# Patient Record
Sex: Female | Born: 1945 | Race: White | Hispanic: No | Marital: Married | Smoking: Former smoker
Health system: Southern US, Community
[De-identification: ages and names within clinical notes are randomized; demographics above are authoritative.]

## PROBLEM LIST (undated history)

## (undated) DIAGNOSIS — M8580 Other specified disorders of bone density and structure, unspecified site: Secondary | ICD-10-CM

## (undated) DIAGNOSIS — M1990 Unspecified osteoarthritis, unspecified site: Secondary | ICD-10-CM

## (undated) DIAGNOSIS — Z923 Personal history of irradiation: Secondary | ICD-10-CM

## (undated) DIAGNOSIS — E039 Hypothyroidism, unspecified: Secondary | ICD-10-CM

## (undated) DIAGNOSIS — K219 Gastro-esophageal reflux disease without esophagitis: Secondary | ICD-10-CM

## (undated) DIAGNOSIS — G4730 Sleep apnea, unspecified: Secondary | ICD-10-CM

## (undated) DIAGNOSIS — IMO0002 Reserved for concepts with insufficient information to code with codable children: Secondary | ICD-10-CM

## (undated) DIAGNOSIS — IMO0001 Reserved for inherently not codable concepts without codable children: Secondary | ICD-10-CM

## (undated) DIAGNOSIS — Z860100 Personal history of colon polyps, unspecified: Secondary | ICD-10-CM

## (undated) DIAGNOSIS — C50919 Malignant neoplasm of unspecified site of unspecified female breast: Secondary | ICD-10-CM

## (undated) DIAGNOSIS — E785 Hyperlipidemia, unspecified: Secondary | ICD-10-CM

## (undated) HISTORY — PX: TONSILLECTOMY: SUR1361

## (undated) HISTORY — PX: ABDOMINAL HYSTERECTOMY: SHX81

## (undated) HISTORY — DX: Other specified disorders of bone density and structure, unspecified site: M85.80

## (undated) HISTORY — PX: CERVICAL DISC SURGERY: SHX588

## (undated) HISTORY — DX: Personal history of colon polyps, unspecified: Z86.0100

## (undated) HISTORY — PX: CATARACT EXTRACTION: SUR2

## (undated) HISTORY — DX: Hyperlipidemia, unspecified: E78.5

## (undated) HISTORY — DX: Hypothyroidism, unspecified: E03.9

## (undated) HISTORY — DX: Gastro-esophageal reflux disease without esophagitis: K21.9

## (undated) HISTORY — DX: Unspecified osteoarthritis, unspecified site: M19.90

## (undated) HISTORY — PX: COLONOSCOPY: SHX174

## (undated) HISTORY — DX: Personal history of colonic polyps: Z86.010

## (undated) HISTORY — DX: Sleep apnea, unspecified: G47.30

---

## 1983-11-22 HISTORY — PX: BUNIONECTOMY: SHX129

## 1998-06-23 ENCOUNTER — Other Ambulatory Visit: Admission: RE | Admit: 1998-06-23 | Discharge: 1998-06-23 | Payer: Self-pay | Admitting: Gynecology

## 1999-06-30 ENCOUNTER — Other Ambulatory Visit: Admission: RE | Admit: 1999-06-30 | Discharge: 1999-06-30 | Payer: Self-pay | Admitting: Gynecology

## 2000-07-10 ENCOUNTER — Encounter: Admission: RE | Admit: 2000-07-10 | Discharge: 2000-07-10 | Payer: Self-pay | Admitting: Gynecology

## 2000-07-10 ENCOUNTER — Encounter: Payer: Self-pay | Admitting: Gynecology

## 2000-07-19 ENCOUNTER — Other Ambulatory Visit: Admission: RE | Admit: 2000-07-19 | Discharge: 2000-07-19 | Payer: Self-pay | Admitting: Gynecology

## 2000-07-26 ENCOUNTER — Encounter: Payer: Self-pay | Admitting: Gynecology

## 2000-07-26 ENCOUNTER — Encounter: Admission: RE | Admit: 2000-07-26 | Discharge: 2000-07-26 | Payer: Self-pay | Admitting: Gynecology

## 2000-10-26 ENCOUNTER — Ambulatory Visit (HOSPITAL_COMMUNITY): Admission: RE | Admit: 2000-10-26 | Discharge: 2000-10-26 | Payer: Self-pay | Admitting: Gastroenterology

## 2000-10-26 ENCOUNTER — Encounter (INDEPENDENT_AMBULATORY_CARE_PROVIDER_SITE_OTHER): Payer: Self-pay | Admitting: Specialist

## 2001-07-13 ENCOUNTER — Encounter: Admission: RE | Admit: 2001-07-13 | Discharge: 2001-07-13 | Payer: Self-pay | Admitting: Gynecology

## 2001-07-13 ENCOUNTER — Encounter: Payer: Self-pay | Admitting: Gynecology

## 2001-07-24 ENCOUNTER — Other Ambulatory Visit: Admission: RE | Admit: 2001-07-24 | Discharge: 2001-07-24 | Payer: Self-pay | Admitting: Gynecology

## 2002-07-15 ENCOUNTER — Encounter: Payer: Self-pay | Admitting: Gynecology

## 2002-07-15 ENCOUNTER — Encounter: Admission: RE | Admit: 2002-07-15 | Discharge: 2002-07-15 | Payer: Self-pay | Admitting: Gynecology

## 2002-08-06 ENCOUNTER — Other Ambulatory Visit: Admission: RE | Admit: 2002-08-06 | Discharge: 2002-08-06 | Payer: Self-pay | Admitting: Gynecology

## 2003-07-18 ENCOUNTER — Encounter: Payer: Self-pay | Admitting: Endocrinology

## 2003-07-18 ENCOUNTER — Encounter: Admission: RE | Admit: 2003-07-18 | Discharge: 2003-07-18 | Payer: Self-pay | Admitting: Endocrinology

## 2003-08-05 ENCOUNTER — Ambulatory Visit (HOSPITAL_COMMUNITY): Admission: RE | Admit: 2003-08-05 | Discharge: 2003-08-06 | Payer: Self-pay | Admitting: Neurosurgery

## 2003-08-05 ENCOUNTER — Encounter: Payer: Self-pay | Admitting: Neurosurgery

## 2003-09-25 ENCOUNTER — Other Ambulatory Visit: Admission: RE | Admit: 2003-09-25 | Discharge: 2003-09-25 | Payer: Self-pay | Admitting: Gynecology

## 2004-01-23 ENCOUNTER — Ambulatory Visit (HOSPITAL_COMMUNITY): Admission: RE | Admit: 2004-01-23 | Discharge: 2004-01-23 | Payer: Self-pay | Admitting: Gastroenterology

## 2004-07-27 ENCOUNTER — Encounter: Admission: RE | Admit: 2004-07-27 | Discharge: 2004-07-27 | Payer: Self-pay | Admitting: Gynecology

## 2004-10-06 ENCOUNTER — Other Ambulatory Visit: Admission: RE | Admit: 2004-10-06 | Discharge: 2004-10-06 | Payer: Self-pay | Admitting: Gynecology

## 2005-08-10 ENCOUNTER — Encounter: Admission: RE | Admit: 2005-08-10 | Discharge: 2005-08-10 | Payer: Self-pay | Admitting: Endocrinology

## 2005-10-10 ENCOUNTER — Other Ambulatory Visit: Admission: RE | Admit: 2005-10-10 | Discharge: 2005-10-10 | Payer: Self-pay | Admitting: Gynecology

## 2006-08-15 ENCOUNTER — Encounter: Admission: RE | Admit: 2006-08-15 | Discharge: 2006-08-15 | Payer: Self-pay | Admitting: Endocrinology

## 2006-10-17 ENCOUNTER — Other Ambulatory Visit: Admission: RE | Admit: 2006-10-17 | Discharge: 2006-10-17 | Payer: Self-pay | Admitting: Gynecology

## 2007-08-21 ENCOUNTER — Encounter: Admission: RE | Admit: 2007-08-21 | Discharge: 2007-08-21 | Payer: Self-pay | Admitting: Endocrinology

## 2007-10-29 ENCOUNTER — Other Ambulatory Visit: Admission: RE | Admit: 2007-10-29 | Discharge: 2007-10-29 | Payer: Self-pay | Admitting: Gynecology

## 2008-08-25 ENCOUNTER — Encounter: Admission: RE | Admit: 2008-08-25 | Discharge: 2008-08-25 | Payer: Self-pay | Admitting: Endocrinology

## 2009-09-11 ENCOUNTER — Encounter: Admission: RE | Admit: 2009-09-11 | Discharge: 2009-09-11 | Payer: Self-pay | Admitting: Endocrinology

## 2010-09-16 ENCOUNTER — Encounter: Admission: RE | Admit: 2010-09-16 | Discharge: 2010-09-16 | Payer: Self-pay | Admitting: Gynecology

## 2011-04-08 NOTE — Op Note (Signed)
[facility]  Patient:    BAYLIE, DRAKES                     MRN: [id number] Proc. Date: 10/26/00 Adm. Date:  [id number] Attending:  Nelda Marseille CC:         [name], M.D.   Operative Report  PROCEDURE:  Colonoscopy with hot biopsy.  ENDOSCOPIST:  Petra Kuba, M.D.  INDICATIONS:  Chronic constipation and hemorrhoids, due for colonic screening. Consent was signed after risks, benefits, methods, and options were thoroughly discussed in the office.  MEDICINES USED:  Demerol 80, Versed 8.  DESCRIPTION OF PROCEDURE:  Rectum was inspected and was pertinent for small hemorrhoids.  Digital exam was negative.  Video colonoscope was inserted and very easily advanced around the colon to the cecum.  On insertion in the mid descending, a 2 to 3 mm polyp was seen and was cold biopsied x 1 to mark it. The scope was then advanced to the cecum by abdominal pressure.  The cecum was identified by the appendiceal orifice and the ileocecal valve.  Resectoscope was inserted a short ways into the terminal ileum which was normal.  Photo documentation was obtained.  The scope was slowly withdrawn.  The prep was adequate.  There was minimal liquid stool that required washing and suctioning on slow withdrawal through the colon.  The cecum, ascending, and a majority of the transverse was normal.  In the splenic flexure, a 1 mm polyp was seen.  It was hot biopsied x 1.  The scope was slowly withdrawn back through the left side of the colon.  Two additional tiny polyps were seen in the sigmoid and were each hot biopsied x 1.  All were put in the same container.  Once back in the rectum, the scope was retroflexed, pertinent for some small internal hemorrhoids.  The scope was straightened and readvanced to the polyps seen on insertion which was at a similar spot.  This was not seen on withdrawal, but we were able to find the spot easily by advancing to roughly 45  cm where we had seen the polyp before.  It was hot biopsied x 1.  Resectoscope was removed.  The patient tolerated the procedure well.  There was no obvious immediate complication.  ENDOSCOPIC DIAGNOSIS: 1. Internal and external hemorrhoids, small. 2. Four left-sided small polyps, all hot biopsied x 1 and one cold biopsied    as well on insertion. 3. Otherwise within normal limits to the terminal ileum.  PLAN:  Await pathology to determine future colonic screening.  Will need post polypectomy instructions.  To see back p.r.n., otherwise yearly rectals and guaiacs, Dr. Nicholas Lose or Dr. [name]. DD:  10/26/00 TD:  10/26/00 Job: [id number]

## 2011-04-08 NOTE — Op Note (Signed)
NAME:  Heather Patton, Heather Patton                        ACCOUNT NO.:  [id number]   MEDICAL RECORD NO.:  [id number]                   PATIENT TYPE:  OIB   LOCATION:  NA                                   FACILITY:  [facility]   PHYSICIAN:  Danae Orleans. Venetia Maxon, M.D.               DATE OF BIRTH:  May 29, 1946   DATE OF PROCEDURE:  08/05/2003  DATE OF DISCHARGE:                                 OPERATIVE REPORT   PREOPERATIVE DIAGNOSIS:  Herniated cervical disk with cervical spondylosis,  degenerative disk disease and cervical radiculopathy, C6-7 level.   POSTOPERATIVE DIAGNOSIS:  Herniated cervical disk with cervical spondylosis,  degenerative disk disease and cervical radiculopathy, C6-7 level.   OPERATION PERFORMED:  Anterior cervical decompression and fusion, C6-7 with  allograft bone graft and anterior cervical plate.   SURGEON:  Danae Orleans. Venetia Maxon, M.D.   ASSISTANT:  Payton Doughty, M.D.   ANESTHESIA:  General endotracheal.   ESTIMATED BLOOD LOSS:  Minimal.   COMPLICATIONS:  None.   DISPOSITION:  To recovery.   INDICATIONS FOR PROCEDURE:  [name] is a 65 year old woman with a  herniated cervical disk at C6-7 on the right with significant right C7  radiculopathy.  It was elected to take her to surgery for anterior cervical  decompression and fusion.   DESCRIPTION OF PROCEDURE:  Ms. Scadden was brought to the operating room.  Following satisfactory and uncomplicated induction of general endotracheal  anesthesia and placement of intravenous lines, the patient was placed in  supine position on the operating table.  Her neck was placed in slight  extension.  She was placed in 10 pounds of halter traction. Her anterior  neck was then prepped and draped in the usual sterile fashion.  The area of  planned incision was infiltrated with 0.25% Marcaine and 0.5% lidocaine with  1:200,000 epinephrine.  Incision was made in the midline and carried to the  anterior border of the sternocleidomastoid  muscle sharply through the  platysma layer.  Sub platysmal dissection was performed, then blunt  dissection was performed along the anterior border of the  sternocleidomastoid muscle keeping the carotid sheath lateral, trachea and  esophagus medial exposing the anterior cervical spine.  Blunt bent spinal  needle was placed at the C5-6 and C6-7 level and intraoperative x-ray  demonstrated needle at the C5-6 level.  Subsequently, the longus colli  muscles were taken down from the anterior cervical spine from C6 through C7  levels bilaterally and a self-retaining Shadowline retractor was placed.  Large ventral osteophytes were removed with a Leksell rongeur and the  interspace, which was entirely degenerated, was incised and the disk  material was removed in piecemeal fashion.  The end plates were stripped of  residual disk material.  Disk space spreader was placed, microscope was  brought into the field and microscopic visualization using a high speed  drill with an A-2 equivalent bur, the end plates  of C6 and C7 were  decorticated and uncinate spurs drilled down.  Subsequently, the degenerated  posterior longitudinal ligament was then incised with an arachnoid knife and  this was removed in piecemeal fashion resulting in significant decompression  of the cervical spinal cord dura and also both C7 nerve roots were  decompressed widely as they extended out their neural foramina.  Hemostasis  was assured with Gelfoam soaked in thrombin and 8 mm corticocancellous bone  graft was inserted into the interspace and countersunk appropriately.  A 22  mm anterior Trinica anterior cervical plate was then affixed to the anterior  cervical spine using variable angled 14 x 4.2 mm screws.  Locking mechanisms  were engaged.  All screws had excellent purchase.  Soft tissues were  inspected and found to be in good repair.  A poorly penetrated x-ray was  obtained which demonstrated the upper screws at the C6  level.  It was felt  that a better x-ray would not be able to be obtained because of the  patient's body habitus because of the shoulders overlapping this level of  the neck. The platysma layer was then closed with 3-0 Vicryl sutures and the  skin edges were reapproximated with running 4-0 Vicryl subcuticular stitch.  The wound was dressed with Dermabond.  The patient was extubated in the  operating room and taken to the recovery room in stable and satisfactory  condition having tolerated the operation well.  Counts were correct at the  end of the case.                                                Danae Orleans. Venetia Maxon, M.D.    JDS/MEDQ  D:  08/05/2003  T:  08/06/2003  Job:  [id number]

## 2011-04-08 NOTE — Op Note (Signed)
NAME:  Heather Patton, Heather Patton                        ACCOUNT NO.:  [id number]   MEDICAL RECORD NO.:  [id number]                   PATIENT TYPE:  AMB   LOCATION:  ENDO                                 FACILITY:  [facility]   PHYSICIAN:  Petra Kuba, M.D.                 DATE OF BIRTH:  [birth year]   DATE OF PROCEDURE:  01/23/2004  DATE OF DISCHARGE:                                 OPERATIVE REPORT   PROCEDURE:  Colonoscopy.   INDICATION:  The patient with a history of colon polyps, due for repeat  screening.  Consent was signed after risks, benefits, methods, options  thoroughly discussed in the office in the past.   MEDICINES USED:  1. Demerol 75.  2. Versed 7.   DESCRIPTION OF PROCEDURE:  Rectal inspection was pertinent for external  hemorrhoids, small.  Digital exam was negative.  The pediatric video  adjustable colonoscope was inserted, easily advanced around the colon to the  cecum.  This did not require any abdominal pressure or any position changes.  No obvious abnormality was seen on insertion.  The cecum was identified by  the appendiceal orifice and the ileocecal valve.  Prep was adequate.  There  was some liquid stool that required washing and suctioning.  On slow  withdrawal through the colon, other than some very early diverticula on the  left side, just beginning to form.  No polyps, tumors, masses, or other  abnormalities were seen as we slowly withdrew back to the rectum.  Anorectal  pull-through and retroflexion confirmed some small hemorrhoids.  Scope was  reinserted a short ways up the left side of the colon; air was suctioned and  scope removed.  The patient tolerated the procedure well.  There was no  obvious immediate complication.   ENDOSCOPIC DIAGNOSES:  1. Internal-external hemorrhoids.  2. Just beginning of left salpingo-oophorectomy diverticula.  3. Otherwise, within normal limits to the cecum.   PLAN:  1. Yearly rectals and guaiacs per Dr. [name].  2. Happy to  see back p.r.n.  3. Otherwise, recheck colon screening in 5 years.                                               Petra Kuba, M.D.   MEM/MEDQ  D:  01/23/2004  T:  01/23/2004  Job:  [id number]   cc:   Jeannett Senior A. [name], M.D.  [address]  [place]  [place] [address]  Fax: [phone]

## 2011-08-11 ENCOUNTER — Other Ambulatory Visit: Payer: Self-pay | Admitting: Gynecology

## 2011-08-11 DIAGNOSIS — Z1231 Encounter for screening mammogram for malignant neoplasm of breast: Secondary | ICD-10-CM

## 2011-09-21 ENCOUNTER — Ambulatory Visit
Admission: RE | Admit: 2011-09-21 | Discharge: 2011-09-21 | Disposition: A | Discharge status: Home / Self Care | Payer: BC Managed Care – PPO | Source: Ambulatory Visit | Attending: Gynecology | Admitting: Gynecology

## 2011-09-21 DIAGNOSIS — Z1231 Encounter for screening mammogram for malignant neoplasm of breast: Secondary | ICD-10-CM

## 2011-09-27 ENCOUNTER — Other Ambulatory Visit: Payer: Self-pay | Admitting: Gynecology

## 2011-09-27 DIAGNOSIS — R928 Other abnormal and inconclusive findings on diagnostic imaging of breast: Secondary | ICD-10-CM

## 2011-10-12 ENCOUNTER — Other Ambulatory Visit: Payer: Self-pay | Admitting: Gynecology

## 2011-10-12 ENCOUNTER — Ambulatory Visit
Admission: RE | Admit: 2011-10-12 | Discharge: 2011-10-12 | Disposition: A | Discharge status: Home / Self Care | Payer: BC Managed Care – PPO | Source: Ambulatory Visit | Attending: Gynecology | Admitting: Gynecology

## 2011-10-12 DIAGNOSIS — R928 Other abnormal and inconclusive findings on diagnostic imaging of breast: Secondary | ICD-10-CM

## 2011-10-12 DIAGNOSIS — N6320 Unspecified lump in the left breast, unspecified quadrant: Secondary | ICD-10-CM

## 2011-10-21 ENCOUNTER — Other Ambulatory Visit: Payer: Self-pay | Admitting: Diagnostic Radiology

## 2011-10-21 ENCOUNTER — Other Ambulatory Visit: Payer: Self-pay | Admitting: Gynecology

## 2011-10-21 ENCOUNTER — Inpatient Hospital Stay: Admission: RE | Admit: 2011-10-21 | Payer: BC Managed Care – PPO | Source: Ambulatory Visit

## 2011-10-21 ENCOUNTER — Ambulatory Visit
Admission: RE | Admit: 2011-10-21 | Discharge: 2011-10-21 | Disposition: A | Discharge status: Home / Self Care | Payer: BC Managed Care – PPO | Source: Ambulatory Visit | Attending: Gynecology | Admitting: Gynecology

## 2011-10-21 DIAGNOSIS — N6320 Unspecified lump in the left breast, unspecified quadrant: Secondary | ICD-10-CM

## 2011-10-24 ENCOUNTER — Other Ambulatory Visit: Payer: Self-pay | Admitting: Gynecology

## 2011-10-24 DIAGNOSIS — C50912 Malignant neoplasm of unspecified site of left female breast: Secondary | ICD-10-CM

## 2011-10-27 ENCOUNTER — Other Ambulatory Visit: Payer: Self-pay | Admitting: *Deleted

## 2011-10-27 ENCOUNTER — Telehealth: Payer: Self-pay | Admitting: *Deleted

## 2011-10-27 DIAGNOSIS — C50919 Malignant neoplasm of unspecified site of unspecified female breast: Secondary | ICD-10-CM

## 2011-10-27 NOTE — Telephone Encounter (Signed)
Confirmed [facility] for 11/02/11 at 1215  Instructions and contact information given.

## 2011-10-28 ENCOUNTER — Ambulatory Visit
Admission: RE | Admit: 2011-10-28 | Discharge: 2011-10-28 | Disposition: A | Discharge status: Home / Self Care | Payer: BC Managed Care – PPO | Source: Ambulatory Visit | Attending: Gynecology | Admitting: Gynecology

## 2011-10-28 DIAGNOSIS — C50912 Malignant neoplasm of unspecified site of left female breast: Secondary | ICD-10-CM

## 2011-10-28 MED ORDER — GADOBENATE DIMEGLUMINE 529 MG/ML IV SOLN
20.0000 mL | Freq: Once | INTRAVENOUS | Status: AC | PRN
  Administered 2011-10-28: 20 mL via INTRAVENOUS

## 2011-11-02 ENCOUNTER — Ambulatory Visit: Payer: BC Managed Care – PPO

## 2011-11-02 ENCOUNTER — Ambulatory Visit
Admission: RE | Admit: 2011-11-02 | Discharge: 2011-11-02 | Disposition: A | Discharge status: Home / Self Care | Payer: BC Managed Care – PPO | Source: Ambulatory Visit | Attending: Radiation Oncology | Admitting: Radiation Oncology

## 2011-11-02 ENCOUNTER — Ambulatory Visit (HOSPITAL_BASED_OUTPATIENT_CLINIC_OR_DEPARTMENT_OTHER): Payer: BC Managed Care – PPO | Admitting: Oncology

## 2011-11-02 ENCOUNTER — Encounter (INDEPENDENT_AMBULATORY_CARE_PROVIDER_SITE_OTHER): Payer: Self-pay | Admitting: General Surgery

## 2011-11-02 ENCOUNTER — Telehealth: Payer: Self-pay | Admitting: *Deleted

## 2011-11-02 ENCOUNTER — Encounter: Payer: Self-pay | Admitting: *Deleted

## 2011-11-02 ENCOUNTER — Ambulatory Visit: Payer: BC Managed Care – PPO | Attending: General Surgery | Admitting: Physical Therapy

## 2011-11-02 ENCOUNTER — Other Ambulatory Visit (HOSPITAL_BASED_OUTPATIENT_CLINIC_OR_DEPARTMENT_OTHER): Payer: BC Managed Care – PPO

## 2011-11-02 ENCOUNTER — Ambulatory Visit (HOSPITAL_BASED_OUTPATIENT_CLINIC_OR_DEPARTMENT_OTHER): Payer: BC Managed Care – PPO | Admitting: General Surgery

## 2011-11-02 VITALS — BP 146/81 | HR 88 | Temp 97.6°F | Ht 63.5 in | Wt 238.7 lb

## 2011-11-02 DIAGNOSIS — Z510 Encounter for antineoplastic radiation therapy: Secondary | ICD-10-CM | POA: Insufficient documentation

## 2011-11-02 DIAGNOSIS — C50312 Malignant neoplasm of lower-inner quadrant of left female breast: Secondary | ICD-10-CM | POA: Insufficient documentation

## 2011-11-02 DIAGNOSIS — C50919 Malignant neoplasm of unspecified site of unspecified female breast: Secondary | ICD-10-CM

## 2011-11-02 DIAGNOSIS — M899 Disorder of bone, unspecified: Secondary | ICD-10-CM | POA: Insufficient documentation

## 2011-11-02 DIAGNOSIS — Z87891 Personal history of nicotine dependence: Secondary | ICD-10-CM | POA: Insufficient documentation

## 2011-11-02 DIAGNOSIS — K219 Gastro-esophageal reflux disease without esophagitis: Secondary | ICD-10-CM | POA: Insufficient documentation

## 2011-11-02 DIAGNOSIS — C50319 Malignant neoplasm of lower-inner quadrant of unspecified female breast: Secondary | ICD-10-CM

## 2011-11-02 DIAGNOSIS — E785 Hyperlipidemia, unspecified: Secondary | ICD-10-CM | POA: Insufficient documentation

## 2011-11-02 DIAGNOSIS — E039 Hypothyroidism, unspecified: Secondary | ICD-10-CM | POA: Insufficient documentation

## 2011-11-02 DIAGNOSIS — Z170 Estrogen receptor positive status [ER+]: Secondary | ICD-10-CM

## 2011-11-02 DIAGNOSIS — IMO0001 Reserved for inherently not codable concepts without codable children: Secondary | ICD-10-CM | POA: Insufficient documentation

## 2011-11-02 DIAGNOSIS — Z803 Family history of malignant neoplasm of breast: Secondary | ICD-10-CM | POA: Insufficient documentation

## 2011-11-02 DIAGNOSIS — Z90710 Acquired absence of both cervix and uterus: Secondary | ICD-10-CM | POA: Insufficient documentation

## 2011-11-02 DIAGNOSIS — G4730 Sleep apnea, unspecified: Secondary | ICD-10-CM | POA: Insufficient documentation

## 2011-11-02 DIAGNOSIS — N63 Unspecified lump in unspecified breast: Secondary | ICD-10-CM

## 2011-11-02 DIAGNOSIS — Z79899 Other long term (current) drug therapy: Secondary | ICD-10-CM | POA: Insufficient documentation

## 2011-11-02 LAB — CBC WITH DIFFERENTIAL/PLATELET
EOS%: 1.8 % (ref 0.0–7.0)
Eosinophils Absolute: 0.1 10*3/uL (ref 0.0–0.5)
HGB: 14.4 g/dL (ref 11.6–15.9)
MCH: 28.8 pg (ref 25.1–34.0)
MCV: 86.3 fL (ref 79.5–101.0)
MONO%: 4.2 % (ref 0.0–14.0)
NEUT#: 5.1 10*3/uL (ref 1.5–6.5)
Platelets: 245 10*3/uL (ref 145–400)
RBC: 5.01 10*6/uL (ref 3.70–5.45)
WBC: 7.1 10*3/uL (ref 3.9–10.3)
lymph#: 1.5 10*3/uL (ref 0.9–3.3)

## 2011-11-02 LAB — COMPREHENSIVE METABOLIC PANEL
AST: 20 U/L (ref 0–37)
Albumin: 4 g/dL (ref 3.5–5.2)
BUN: 13 mg/dL (ref 6–23)
Calcium: 9.9 mg/dL (ref 8.4–10.5)
Glucose, Bld: 181 mg/dL — ABNORMAL HIGH (ref 70–99)
Potassium: 3.7 mEq/L (ref 3.5–5.3)
Total Bilirubin: 0.4 mg/dL (ref 0.3–1.2)
Total Protein: 7.8 g/dL (ref 6.0–8.3)

## 2011-11-02 LAB — CANCER ANTIGEN 27.29: CA 27.29: 27 U/mL (ref 0–39)

## 2011-11-02 NOTE — Telephone Encounter (Signed)
gave patient appointment for 12-2011 printed out calendar and gave to the patient 

## 2011-11-02 NOTE — Progress Notes (Signed)
Mailed after appt letter to pt. 

## 2011-11-02 NOTE — Progress Notes (Signed)
CC: Heather Patton    HPI: The patient is a 65 year old [place] woman who had routine screening mammography at the breast center 09/21/2011. The possible mass was noted in the left breast, and she was recalled for additional views November 21. Diagnostic mammography showed a low density spiculated mass in the lower outer quadrant of the left breast measuring 6 mm. This was not palpable to the mammographer. Ultrasound showed only normal tissue in the area in question.  According stereotactic biopsy was performed 10/21/2011 and showed ([id number]) and invasive lobular carcinoma, grade 1 or 2, with no her 2 amplification, 99% estrogen receptor positive, 94% progesterone receptor positive, with an MIB-1-1 of 15%.  With this information the patient underwent bilateral breast MRI 10/28/2011. A post biopsy hematoma was noted in the lower inner aspect of the left breast, with a small area of masslike enhancement associated with it. This area measured 7 mm. The MRI showed a second irregularly marginated mass in the central portion of the left breast 2.6 cm away from the biopsied mass. The second mass measured 5 mm maximally. There was no evidence of the ligaments in the right breast and no adenopathy noted.  With this information the patient was referred to the multidisciplinary breast clinic on 11/02/2011  Past medical history:    Past Medical History  Diagnosis Date  . Degenerative joint disease   . GERD (gastroesophageal reflux disease)   . Osteopenia   . Sleep apnea   . Hx of colonic polyps   . Hypothyroidism   . Hyperlipidemia     Past surgical history:     Past Surgical History  Procedure Date  . Tonsillectomy   . Abdominal hysterectomy   . Cervical disc surgery     Family history:    Family History  Problem Relation Age of Onset  . Cancer Mother   . Cancer Cousin    the patient's father died at the age of 68 from a myocardial infarction. The patient's mother was diagnosed with  breast cancer at age 37, but she died at age 46 following a fall. The patient had no brothers, has one sister. The only other breast cancer diagnosis in the family was a cousin on the maternal side. The patient does not know at what age she was diagnosed. There is no history of ovarian cancer in the family.  GYN history:  GX P0. Menarche age 45, menopause 9 she took hormone replacement for about 10 years, discontinuing that in 2006.  Social history:  She is a retired [employer] officer. Her husband Elijah Birk presents today is retired from the [employer]. The patient is a close friend of one of my patients, Airlie Blumenberg.  Health maintenance:  She smoked perhaps a half a pack a day up until 1980. She drinks about a glass of wine at night with supper. She had a colonoscopy in May of 2010, which showed no high-grade dysplasia or malignancy and the adenomas removed. She had a bone density September of 2012 which she tells me was normal. This is available through Dr. Leonette Most Lomax's office. Most recent Pap smear was September of 2012. The patient does have advanced directives in place. Her husband Elijah Birk is her healthcare power of attorney.  Allergies: No Known Allergies  Current outpatient prescriptions:calcium citrate-vitamin D (CITRACAL+D) 315-200 MG-UNIT per tablet, Take 1 tablet by mouth daily. Calcium citrate 630mg     Vitamin D  500iu , Disp: , Rfl: ;  ezetimibe-simvastatin (VYTORIN) 10-80 MG per tablet,  Take 1 tablet by mouth at bedtime.  , Disp: , Rfl: ;  levothyroxine (SYNTHROID, LEVOTHROID) 150 MCG tablet, Take 150 mcg by mouth daily.  , Disp: , Rfl:  OVER THE COUNTER MEDICATION, Take 100 mg by mouth. Stool softner , Disp: , Rfl:   ROS in detail review of systems today was entirely negative, and certainly does not give me any hands of metastatic disease. She does not exercise on a regular basis.  Physical Exam:  Middle-aged white woman who appears comfortable.  Blood pressure 146/81, pulse 88,  temperature 97.6 F (36.4 C), temperature source Oral, height 5' 3.5" (1.613 m), weight 238 lb 11.2 oz (108.274 kg).  Sclerae unicteric Oropharynx clear No peripheral adenopathy, with a negative left axilla Lungs no rales or rhonchi Heart regular rate and rhythm Abd benign MSK no focal spinal tenderness, no peripheral edema Neuro: nonfocal Breasts: Right breast no suspicious masses. Left breast status post recent lumpectomy. I do not palpate any discreet mass. There is no skin change or nipple retraction.  LABS      Component Value Date/Time   WBC 7.1 11/02/2011 1219   RBC 5.01 11/02/2011 1219   HGB 14.4 11/02/2011 1219   HCT 43.2 11/02/2011 1219   PLT 245 11/02/2011 1219   MCV 86.3 11/02/2011 1219   MCH 28.8 11/02/2011 1219   MCHC 33.3 11/02/2011 1219   RDW 13.5 11/02/2011 1219   LYMPHSABS 1.5 11/02/2011 1219   MONOABS 0.3 11/02/2011 1219   EOSABS 0.1 11/02/2011 1219   BASOSABS 0.0 11/02/2011 1219      Chemistry      Component Value Date/Time   NA 140 11/02/2011 1219   K 3.7 11/02/2011 1219   CL 104 11/02/2011 1219   CO2 26 11/02/2011 1219   BUN 13 11/02/2011 1219   CREATININE 0.69 11/02/2011 1219      Component Value Date/Time   CALCIUM 9.9 11/02/2011 1219   ALKPHOS 92 11/02/2011 1219   AST 20 11/02/2011 1219   ALT 23 11/02/2011 1219   BILITOT 0.4 11/02/2011 1219        Studies:   US Breast Left  10/12/2011  *RADIOLOGY REPORT*  Clinical Data:  Abnormal screening, left breast  DIGITAL DIAGNOSTIC LEFT MAMMOGRAM WITHOUT CAD AND LEFT BREAST ULTRASOUND:  Comparison:  Multiple priors  Findings:  Spot compression views in the lower outer quadrant of the left breast, middle third confirm the presence of a low density spiculated mass with ill-defined margins measuring approximately 0.6 x 0.4 cm.  On physical exam, I palpate normal tissue in the lower inner quadrant and medial central aspect of the left breast.  Ultrasound is performed, showing normal tissue in the  lower inner quadrant of the left breast and medial central aspect of the left breast.  Considering the appearance on mammography, a biopsy is recommended.  IMPRESSION: Mass, left breast which a stereotactic biopsy is scheduled for November 30th at 3:00 p.m.  BI-RADS CATEGORY 4:  Suspicious abnormality - biopsy should be considered.  Original Report Authenticated By: Hiram Gash, M.D.   Mr Breast Bilateral W Wo Contrast  11/01/2011  **ADDENDUM** CREATED: 11/01/2011 16:28:29  At the request of patient, I discussed the findings with her by telephone.  She is scheduled to be seen in the multidisciplinary clinic on 11/02/2011.  Addended by:  Blair Hailey. Manson Passey, M.D. on 11/01/2011 16:28:29.  **END ADDENDUM** SIGNED BY: Blair Hailey. Manson Passey, M.D.   11/01/2011  *RADIOLOGY REPORT*  Clinical Data: Recently diagnosed left breast invasive  lobular carcinoma.  Mother diagnosed with breast cancer at age 45.  The patient also reports a cousin diagnosed with breast cancer.  BUN and creatinine were obtained on site at [facility] at [address] Results:  BUN 12 mg/dL,  Creatinine 0.8 mg/dL.  BILATERAL BREAST MRI WITH AND WITHOUT CONTRAST  Technique: Multiplanar, multisequence MR images of both breasts were obtained prior to and following the intravenous administration of 20ml of MultiHance.  Three dimensional images were evaluated at the independent DynaCad workstation.  Comparison:  Recent mammogram, ultrasound and biopsy examinations.  Findings: Minimal background parenchymal enhancement in both breasts.  Small postbiopsy hematoma and biopsy marker clip artifact in the lower inner aspect of the midportion of the left breast.  This corresponds to the location of the recently biopsied invasive lobular carcinoma.  At the posterior margin of the biopsy cavity, there is a small, oval area of mass-like enhancement.  This has a mixture of enhancement kinetics, including rapid washin/washout. This area measures 7  x 7 x 2 mm in maximum dimensions and has mildly irregular margins.  There is a second small, irregularly marginated oval mass in the central portion of the left breast.  This is located 2.6 cm superior, posterior and lateral to the biopsy-proven malignancy. This mass measures 5 x 5 x 4 mm in maximum dimensions and has a mixture of enhancement kinetics, including rapid washin/washout. No corresponding mammographic abnormality.  The two masses have a combined outer margin to outer margin diameter of 3.7 cm.  No additional masses or areas of enhancement suspicious for malignancy in either breast.  No abnormal appearing lymph nodes.  IMPRESSION:  1.  7 x 7 x 2 mm biopsy-proven invasive lobular carcinoma in the lower inner quadrant of the left breast. 2.  5 x 5 x 4 mm mass in the central left breast with MR features highly suspicious for malignancy.  If breast conservation is desired, an MR guided core needle biopsy of this mass would be recommended. 3.  No evidence of malignancy in the right breast and no adenopathy.  THREE-DIMENSIONAL MR IMAGE RENDERING ON INDEPENDENT WORKSTATION:  Three-dimensional MR images were rendered by post-processing of the original MR data on an independent workstation.  The three- dimensional MR images were interpreted, and findings were reported in the accompanying complete MRI report for this study.  BI-RADS CATEGORY 5:  Highly suggestive of malignancy - appropriate action should be taken.  Recommendation:  Left breast MR guided core needle biopsy if breast conservation is desired.  Original Report Authenticated By: Patterson Hammersmith, M.D.   Mm Breast Stereo Biopsy Left  10/24/2011  **ADDENDUM** CREATED: 10/24/2011 14:13:15  Invasive mammary carcinoma with lobular features was reported histologically.  This corresponds well with the imaging findings. The patient was contacted by telephone on 10/24/2011 and given the results of the biopsy.  She states the wound site is clean and dry with  no hematoma.  The patient was informed that she would be contacted by the Multidisciplinary Clinic regarding an appointment on 11/02/2011.  She has also been scheduled  for a breast MRI.  Addended by:  Littie Deeds. Judyann Munson, M.D. on 10/24/2011 14:13:15.  **END ADDENDUM** SIGNED BY: Littie Deeds. Judyann Munson, M.D.   10/24/2011  *RADIOLOGY REPORT*  Clinical Data:  Asymmetry left breast lower inner quadrant  STEREOTACTIC-GUIDED VACUUM ASSISTED BIOPSY OF THE LEFT BREAST AND SPECIMEN RADIOGRAPH  Informed written consent was obtained.  There is an asymmetry in the left breast lower inner quadrant, as  seen on recent screening and diagnostic mammography performed 09/21/11 and 10/12/11.  Using sterile technique, 2% lidocaine, stereotactic guidance, and a 9 gauge vacuum assisted device, biopsy was performed of the left asymmetry.  At the conclusion of the procedure, a tissue marker clip was deployed into the biopsy cavity.  Follow-up 2-view mammogram confirmed clip to be in correct position.  IMPRESSION: Stereotactic-guided biopsy of left breast asymmetry.  No apparent complications.  Original Report Authenticated By: Littie Deeds. Judyann Munson, M.D.   Mm Digital Diag Ltd L  10/12/2011  *RADIOLOGY REPORT*  Clinical Data:  Abnormal screening, left breast  DIGITAL DIAGNOSTIC LEFT MAMMOGRAM WITHOUT CAD AND LEFT BREAST ULTRASOUND:  Comparison:  Multiple priors  Findings:  Spot compression views in the lower outer quadrant of the left breast, middle third confirm the presence of a low density spiculated mass with ill-defined margins measuring approximately 0.6 x 0.4 cm.  On physical exam, I palpate normal tissue in the lower inner quadrant and medial central aspect of the left breast.  Ultrasound is performed, showing normal tissue in the lower inner quadrant of the left breast and medial central aspect of the left breast.  Considering the appearance on mammography, a biopsy is recommended.  IMPRESSION: Mass, left breast which a stereotactic biopsy is  scheduled for November 30th at 3:00 p.m.  BI-RADS CATEGORY 4:  Suspicious abnormality - biopsy should be considered.  Original Report Authenticated By: Hiram Gash, M.D.     Assessment: 65 year old [place] woman status post left breast biopsy November of 2012 for a clinical T1b N0 (Stage I) invasive lobular carcinoma, grade 1 or 2, strongly estrogen and progesterone receptor positive, HER-2 not amplified, with an MIB-1-1 of 15%; with a second, 5 mm mass in the same breast noted by MRI.  Plan: We spent the better part of her hour-long visit today discussing the difference between ductal and lobular breast cancers. She understands that we essentially treat these the same, but there is subtle differences including difficulty in detecting lobular, difficulty palpating the edge of the lobular cancer during surgery, and decreased response to neoadjuvant chemotherapy. She understands we need to first of all biopsy the second mass in the ipsilateral breast. If this is also of carcinoma, it is not clear whether or a large lumpectomy may encompass both masses with good cosmesis. Accordingly a mastectomy would have to be considered. If so, she should meet with a plastic surgeon to discuss reconstructive options. On the other hand if the second masses negative she would be an excellent candidate for breast conserving surgery.   From the systemic therapy point of view I do not anticipate her requiring chemotherapy and therefore would not suggest port placement at the time of definitive surgery. I have made her a return appointment here early February. By then we should have all the information necessary to be able to provide her with detailed prognostic information and make it a tentative decision regarding adjuvant systemic therapy.  This plan is concordant with NCCN guidelines.   Kushi Kun C 11/02/2011, 6:40 PM

## 2011-11-02 NOTE — Progress Notes (Signed)
Subjective:     Patient ID: Heather Patton, female   DOB: 1946/01/09, 65 y.o.   MRN: [id number]  HPI We are asked to see the patient in consultation by Dr. Anselmo Pickler to evaluate her for a left breast cancer. The patient is a 65 year old white female who recently underwent a screening mammogram. At that time she was not having any breast pain. She was not having discharge from her nipple on either side. Her mammogram identified a lesion in the lower inner quadrant of the left breast. By MRI this measured 7 mm. This was biopsied and came back as an invasive breast cancer. A second lesion was also identified by MRI 2.5 cm away. This is scheduled to be biopsied in the near future. She has otherwise been healthy. She has no complaints today. Her tumor is ER/PR positive and HER-2 negative.  Review of Systems  Constitutional: Negative.   HENT: Negative.   Eyes: Negative.   Respiratory: Negative.   Cardiovascular: Negative.   Gastrointestinal: Negative.   Genitourinary: Negative.   Musculoskeletal: Negative.   Skin: Negative.   Neurological: Negative.   Hematological: Negative.   Psychiatric/Behavioral: Negative.        Objective:   Physical Exam  Constitutional: She is oriented to person, place, and time. She appears well-developed and well-nourished.  HENT:  Head: Normocephalic and atraumatic.  Eyes: Conjunctivae and EOM are normal. Pupils are equal, round, and reactive to light.  Neck: Normal range of motion. Neck supple.  Cardiovascular: Normal rate, regular rhythm and normal heart sounds.   Pulmonary/Chest: Effort normal and breath sounds normal.       She has no palpable mass in either breast. No axillary supraclavicular cervical lymphadenopathy.  Abdominal: Soft. Bowel sounds are normal. She exhibits no mass. There is no tenderness.  Musculoskeletal: Normal range of motion.  Lymphadenopathy:    She has no cervical adenopathy.  Neurological: She is alert and oriented to person,  place, and time.  Skin: Skin is warm and dry.  Psychiatric: She has a normal mood and affect. Her behavior is normal.       Assessment:     The patient has a small invasive cancer in the lower inner quadrant of the left breast. She has a second area of concern nearby. If the second area is benign and she is a great candidate for breast conservation and would like to pursue this. If the second area is positive then she may still be a candidate for breast conservation given the generous size of her breast but it may be a little more complicated. She is trying to decide of the second area is positive whether she would want a mastectomy or not.    Plan:     She is scheduled for a biopsy of the secondary in the left breast in the next few days. We will call her with the results of her pathology. She will continue to consider her options so that she can make an informed decision once the second biopsy pathology is back

## 2011-11-02 NOTE — Progress Notes (Signed)
CC:   [name]. [name], M.D. [name], M.D. [name], M.D. Ollen Gross. [name], M.D.  REFERRING PHYSICIAN:  [name]. [name], M.D.  DIAGNOSIS:  Invasive mammary carcinoma of the left breast.  HPI:  Heather Patton is a very pleasant 65 year old female who is seen out the courtesy of Dr. [name] as part of the Multidisciplinary Breast [facility].  On routine screening mammography earlier this year, Heather Patton was noted to have a possible mass within the left breast.  In light of this, a digital diagnostic left mammogram was performed which revealed a spiculated mass with ill-defined borders measuring approximately 0.6 x 0.4 cm within the lower-inner quadrant of the left breast.  Ultrasound of this area revealed no suspicious changes.  The patient did proceed to undergo biopsy of the lower-inner quadrant of the left breast which revealed invasive mammary carcinoma, likely grade 1 or grade 2.  Features were most significant with invasive lobular carcinoma.  The tumor was HER2/neu negative, but ER/PR positive at 99% and 94% respectively.  Ki-67 was normal at 15%.  In light of these findings, the patient proceeded to undergo MRI of the chest which revealed within the lower-inner quadrant of the left breast a 7 x 7 x 2 mm lesion.  In addition, within the central left breast there was a separate area which measured 5 x 5 x 4 cm.  The second area was approximately 2.6 cm superior and posterior as well as lateral to the biopsy-proven malignancy.  The total area of the 2 masses was approximately 3.7 cm.  With these findings, the patient is now seen as part of the Multidisciplinary Breast Clinic.  PAST MEDICAL HISTORY:  ALLERGIES:  The patient has no known drug allergies.  CURRENT MEDICATIONS:  Vytorin 10/80 mg 1 tablet at bedtime.  The patient is also on Synthroid 150 mcg daily.  The patient also takes a calcium supplement.  PRIOR SURGERIES:  Tonsillectomy, abdominal hysterectomy,  and cervical disk surgery.  MEDICAL HISTORY:  Significant for GERD, osteopenia, sleep apnea, history of colonic polyps, hypothyroidism, and hyperlipidemia.  SOCIAL HISTORY:  The patient is a former smoker but stopped smoking in 1980.  The patient previously smoked a half-pack of cigarettes per day. The patient has approximately 6 glasses of wine per week.  The patient previously worked at [employer] but is now retired.  FAMILY HISTORY:  Significant for mother with history of breast cancer diagnosed at approximately age 71.  Also significant for cousin (maternal) with a questionable history of breast cancer.  There is no obvious family history of ovarian cancer.  REVIEW OF SYSTEMS:  The patient denied any pain in the breast area, nipple discharge, or bleeding prior to diagnosis.  The patient denies any new bony pain, headaches, dizziness, or blurred vision.  A complete review of systems was undertaken with the patient by myself and other than above-mentioned issues is unremarkable.  PHYSICAL EXAMINATION:  General:  This is a very pleasant 65 year old female in no acute distress.  She is accompanied by her husband on evaluation today.  Vital Signs:  Temperature 97.6, pulse 88, blood pressure is 146/81.  Height is 5 feet 3.5 inches.  Weight is 238 pounds. Examination of the neck and supraclavicular region reveals no evidence of adenopathy.  The axillary areas are free of adenopathy.  Examination of the lungs reveals them to be clear.  Heart:  Regular rhythm and rate. Examination the right breast reveals it to be large and pendulous  without mass or nipple discharge.  Examination of the left breast reveals it also to be large and pendulous without obvious mass or nipple discharge.  There is pain at the biopsy site in the lower-inner aspect of the left breast with some associated bruising.  Abdomen:  Soft and nontender with normal bowel sounds.  Neurological  Examination: Nonfocal.  LABORATORY DATA FROM TODAY:  White count 7.1, absolute neutrophil count is 5.1, hemoglobin 14.4, hematocrit 43.2, platelet count 145,000. Glucose is elevated at 181.  BUN 13, creatinine 0.69, potassium is 3.7, calcium is 9.9.  X-RAY STUDIES:  As summarized in the HPI.  IMPRESSION AND PLAN:  Invasive mammary carcinoma of the left breast. The patient has a separate area which will need to be biopsied before treatment planning can be generated.  The patient does understand this issue.  The patient seems to be leaning towards possible mastectomy if her second biopsy is positive.  But, given the close proximity to her known diagnosis site, the patient may be a potential candidate for breast-conserving therapy given her large breast size.  I, today, discussed in general terms radiation therapy side effects and potential toxicities with Heather Patton and her husband.  The patient appears to understand these issues at this time.  Final details concerning the patient's management are pending her second biopsy result.    ______________________________ Billie Lade, Ph.D., M.D. JDK/MEDQ  D:  11/02/2011  T:  11/02/2011  Job:  [id number]

## 2011-11-03 ENCOUNTER — Other Ambulatory Visit: Payer: Self-pay | Admitting: Oncology

## 2011-11-03 ENCOUNTER — Telehealth: Payer: Self-pay | Admitting: *Deleted

## 2011-11-03 NOTE — Telephone Encounter (Signed)
Pt left vm stating she has lost pathology report and requests a copy. Called pt and informed will send a copy to her in the mail.

## 2011-11-04 ENCOUNTER — Other Ambulatory Visit: Payer: Self-pay | Admitting: Gynecology

## 2011-11-04 DIAGNOSIS — R928 Other abnormal and inconclusive findings on diagnostic imaging of breast: Secondary | ICD-10-CM

## 2011-11-07 ENCOUNTER — Ambulatory Visit: Payer: BC Managed Care – PPO | Admitting: Genetic Counselor

## 2011-11-07 ENCOUNTER — Ambulatory Visit
Admission: RE | Admit: 2011-11-07 | Discharge: 2011-11-07 | Disposition: A | Discharge status: Home / Self Care | Payer: BC Managed Care – PPO | Source: Ambulatory Visit | Attending: Gynecology | Admitting: Gynecology

## 2011-11-07 ENCOUNTER — Other Ambulatory Visit (HOSPITAL_COMMUNITY): Payer: Self-pay | Admitting: Diagnostic Radiology

## 2011-11-07 DIAGNOSIS — R928 Other abnormal and inconclusive findings on diagnostic imaging of breast: Secondary | ICD-10-CM

## 2011-11-07 MED ORDER — GADOBENATE DIMEGLUMINE 529 MG/ML IV SOLN
20.0000 mL | Freq: Once | INTRAVENOUS | Status: AC | PRN
  Administered 2011-11-07: 20 mL via INTRAVENOUS

## 2011-11-07 NOTE — Progress Notes (Signed)
Patient seen for genetic counseling. Blood sent to [facility] for BRCA1/2 testing. TAT 2 weeks.

## 2011-11-08 ENCOUNTER — Telehealth: Payer: Self-pay | Admitting: *Deleted

## 2011-11-08 NOTE — Telephone Encounter (Signed)
Spoke to pt concerning [facility] from 11/02/11.  Pt denies needs or concerns at this time.  Pt relate she understands dx, staging, and prognostic panel.  Encourage pt to call with questions.  Received verbal understanding.  Contact information given.

## 2011-11-09 ENCOUNTER — Encounter (INDEPENDENT_AMBULATORY_CARE_PROVIDER_SITE_OTHER): Payer: Self-pay

## 2011-11-09 ENCOUNTER — Other Ambulatory Visit (INDEPENDENT_AMBULATORY_CARE_PROVIDER_SITE_OTHER): Payer: Self-pay

## 2011-11-22 DIAGNOSIS — C50919 Malignant neoplasm of unspecified site of unspecified female breast: Secondary | ICD-10-CM

## 2011-11-22 HISTORY — PX: BREAST LUMPECTOMY: SHX2

## 2011-11-22 HISTORY — DX: Malignant neoplasm of unspecified site of unspecified female breast: C50.919

## 2011-11-28 ENCOUNTER — Telehealth: Payer: Self-pay | Admitting: Genetic Counselor

## 2011-11-30 ENCOUNTER — Encounter (INDEPENDENT_AMBULATORY_CARE_PROVIDER_SITE_OTHER): Payer: Self-pay | Admitting: General Surgery

## 2011-11-30 ENCOUNTER — Ambulatory Visit (INDEPENDENT_AMBULATORY_CARE_PROVIDER_SITE_OTHER): Payer: BC Managed Care – PPO | Admitting: General Surgery

## 2011-11-30 VITALS — BP 144/84 | HR 76 | Temp 96.8°F | Resp 12 | Ht 64.0 in | Wt 241.8 lb

## 2011-11-30 DIAGNOSIS — C50319 Malignant neoplasm of lower-inner quadrant of unspecified female breast: Secondary | ICD-10-CM

## 2011-11-30 NOTE — Progress Notes (Signed)
Subjective:     Patient ID: Heather Patton, female   DOB: 1946/05/22, 66 y.o.   MRN: [id number]  HPI The patient is a 66 a white female who has a known small invasive cancer the left breast. On her MRI study secondary was identified. This was biopsied and came back as atypical lobular hyperplasia. At this point she presents for preoperative planning.  Review of Systems     Objective:   Physical Exam On exam she has no palpable mass in either breast. No axillary supraclavicular or cervical lymphadenopathy.   Assessment:     Small invasive left breast cancer with an area of atypical lobular hyperplasia    Plan:     After lengthy discussion the patient has decided on breast conservation therapy. I think this can be done reasonably with both areas. She will also need a sentinel node mapping. Have discussed with her in detail the risks and benefits of the operation to this as well as some of the technical aspects and she understands and wishes to proceed.

## 2011-12-01 ENCOUNTER — Other Ambulatory Visit (INDEPENDENT_AMBULATORY_CARE_PROVIDER_SITE_OTHER): Payer: Self-pay | Admitting: General Surgery

## 2011-12-01 ENCOUNTER — Encounter (HOSPITAL_BASED_OUTPATIENT_CLINIC_OR_DEPARTMENT_OTHER): Payer: Self-pay | Admitting: *Deleted

## 2011-12-01 DIAGNOSIS — C50319 Malignant neoplasm of lower-inner quadrant of unspecified female breast: Secondary | ICD-10-CM

## 2011-12-01 NOTE — Progress Notes (Signed)
To go to [facility] for cxr No labs per anesth Bring cpap dos-knows to wear post op

## 2011-12-02 ENCOUNTER — Ambulatory Visit
Admission: RE | Admit: 2011-12-02 | Discharge: 2011-12-02 | Disposition: A | Discharge status: Home / Self Care | Payer: BC Managed Care – PPO | Source: Ambulatory Visit | Attending: General Surgery | Admitting: General Surgery

## 2011-12-05 ENCOUNTER — Ambulatory Visit (HOSPITAL_COMMUNITY)
Admission: RE | Admit: 2011-12-05 | Discharge: 2011-12-05 | Disposition: A | Discharge status: Home / Self Care | Payer: BC Managed Care – PPO | Source: Ambulatory Visit | Attending: General Surgery | Admitting: General Surgery

## 2011-12-05 ENCOUNTER — Ambulatory Visit: Admit: 2011-12-05 | Payer: BC Managed Care – PPO

## 2011-12-05 ENCOUNTER — Encounter (HOSPITAL_BASED_OUTPATIENT_CLINIC_OR_DEPARTMENT_OTHER): Payer: Self-pay | Admitting: Anesthesiology

## 2011-12-05 ENCOUNTER — Ambulatory Visit
Admit: 2011-12-05 | Discharge: 2011-12-05 | Disposition: A | Discharge status: Home / Self Care | Payer: BC Managed Care – PPO | Attending: General Surgery | Admitting: General Surgery

## 2011-12-05 ENCOUNTER — Encounter (HOSPITAL_BASED_OUTPATIENT_CLINIC_OR_DEPARTMENT_OTHER)
Admission: RE | Disposition: A | Discharge status: Home / Self Care | Payer: Self-pay | Source: Ambulatory Visit | Attending: General Surgery

## 2011-12-05 ENCOUNTER — Ambulatory Visit (HOSPITAL_BASED_OUTPATIENT_CLINIC_OR_DEPARTMENT_OTHER): Payer: BC Managed Care – PPO | Admitting: Anesthesiology

## 2011-12-05 ENCOUNTER — Ambulatory Visit (HOSPITAL_BASED_OUTPATIENT_CLINIC_OR_DEPARTMENT_OTHER)
Admission: RE | Admit: 2011-12-05 | Discharge: 2011-12-05 | Disposition: A | Discharge status: Home / Self Care | Payer: BC Managed Care – PPO | Source: Ambulatory Visit | Attending: General Surgery | Admitting: General Surgery

## 2011-12-05 ENCOUNTER — Ambulatory Visit (HOSPITAL_COMMUNITY): Payer: BC Managed Care – PPO

## 2011-12-05 ENCOUNTER — Ambulatory Visit
Admission: RE | Admit: 2011-12-05 | Discharge: 2011-12-05 | Disposition: A | Discharge status: Home / Self Care | Payer: BC Managed Care – PPO | Source: Ambulatory Visit | Attending: General Surgery | Admitting: General Surgery

## 2011-12-05 ENCOUNTER — Other Ambulatory Visit (INDEPENDENT_AMBULATORY_CARE_PROVIDER_SITE_OTHER): Payer: Self-pay | Admitting: General Surgery

## 2011-12-05 ENCOUNTER — Encounter (HOSPITAL_BASED_OUTPATIENT_CLINIC_OR_DEPARTMENT_OTHER): Payer: Self-pay | Admitting: *Deleted

## 2011-12-05 DIAGNOSIS — E039 Hypothyroidism, unspecified: Secondary | ICD-10-CM | POA: Insufficient documentation

## 2011-12-05 DIAGNOSIS — D0590 Unspecified type of carcinoma in situ of unspecified breast: Secondary | ICD-10-CM

## 2011-12-05 DIAGNOSIS — C50319 Malignant neoplasm of lower-inner quadrant of unspecified female breast: Secondary | ICD-10-CM

## 2011-12-05 DIAGNOSIS — K219 Gastro-esophageal reflux disease without esophagitis: Secondary | ICD-10-CM | POA: Insufficient documentation

## 2011-12-05 DIAGNOSIS — G4730 Sleep apnea, unspecified: Secondary | ICD-10-CM | POA: Insufficient documentation

## 2011-12-05 HISTORY — PX: BREAST BIOPSY: SHX20

## 2011-12-05 SURGERY — BREAST LUMPECTOMY WITH SENTINEL LYMPH NODE BX
Anesthesia: General | Site: Breast | Laterality: Left | Wound class: Clean

## 2011-12-05 MED ORDER — METHYLENE BLUE 1 % INJ SOLN
INTRAMUSCULAR | Status: DC | PRN
  Administered 2011-12-05: 2 mL via SUBMUCOSAL

## 2011-12-05 MED ORDER — BUPIVACAINE HCL (PF) 0.25 % IJ SOLN
INTRAMUSCULAR | Status: DC | PRN
  Administered 2011-12-05: 25 mL

## 2011-12-05 MED ORDER — LACTATED RINGERS IV SOLN
INTRAVENOUS | Status: DC
  Administered 2011-12-05 (×2): via INTRAVENOUS

## 2011-12-05 MED ORDER — ACETAMINOPHEN 10 MG/ML IV SOLN
1000.0000 mg | Freq: Once | INTRAVENOUS | Status: AC
  Administered 2011-12-05: 1000 mg via INTRAVENOUS

## 2011-12-05 MED ORDER — HYDROCODONE-ACETAMINOPHEN 5-325 MG PO TABS: 1.0000 | ORAL_TABLET | ORAL | Status: AC | PRN

## 2011-12-05 MED ORDER — MIDAZOLAM HCL 2 MG/2ML IJ SOLN
0.5000 mg | INTRAMUSCULAR | Status: DC | PRN
  Administered 2011-12-05: 1 mg via INTRAVENOUS

## 2011-12-05 MED ORDER — ONDANSETRON HCL 4 MG/2ML IJ SOLN
INTRAMUSCULAR | Status: DC | PRN
  Administered 2011-12-05: 4 mg via INTRAVENOUS

## 2011-12-05 MED ORDER — METOCLOPRAMIDE HCL 5 MG/ML IJ SOLN: 10.0000 mg | Freq: Once | INTRAMUSCULAR | Status: DC | PRN

## 2011-12-05 MED ORDER — FENTANYL CITRATE 0.05 MG/ML IJ SOLN
50.0000 ug | INTRAMUSCULAR | Status: DC | PRN
Start: 2011-12-05 — End: 2011-12-05
  Administered 2011-12-05: 100 ug via INTRAVENOUS

## 2011-12-05 MED ORDER — DEXAMETHASONE SODIUM PHOSPHATE 4 MG/ML IJ SOLN
INTRAMUSCULAR | Status: DC | PRN
  Administered 2011-12-05: 10 mg via INTRAVENOUS

## 2011-12-05 MED ORDER — LIDOCAINE HCL (CARDIAC) 20 MG/ML IV SOLN
INTRAVENOUS | Status: DC | PRN
  Administered 2011-12-05: 100 mg via INTRAVENOUS

## 2011-12-05 MED ORDER — SODIUM CHLORIDE 0.9 % IJ SOLN
INTRAMUSCULAR | Status: DC | PRN
  Administered 2011-12-05: 3 mL via INTRAVENOUS

## 2011-12-05 MED ORDER — DROPERIDOL 2.5 MG/ML IJ SOLN
INTRAMUSCULAR | Status: DC | PRN
  Administered 2011-12-05: 0.625 mg via INTRAVENOUS

## 2011-12-05 MED ORDER — CEFAZOLIN SODIUM-DEXTROSE 2-3 GM-% IV SOLR
2.0000 g | INTRAVENOUS | Status: AC
  Administered 2011-12-05: 2 g via INTRAVENOUS

## 2011-12-05 MED ORDER — TECHNETIUM TC 99M SULFUR COLLOID FILTERED
1.0000 | Freq: Once | INTRAVENOUS | Status: AC | PRN
  Administered 2011-12-05: 1 via INTRADERMAL

## 2011-12-05 MED ORDER — MIDAZOLAM HCL 5 MG/5ML IJ SOLN
INTRAMUSCULAR | Status: DC | PRN
  Administered 2011-12-05: 2 mg via INTRAVENOUS

## 2011-12-05 MED ORDER — HYDROMORPHONE HCL PF 1 MG/ML IJ SOLN
0.2500 mg | INTRAMUSCULAR | Status: DC | PRN
  Administered 2011-12-05 (×4): 0.5 mg via INTRAVENOUS

## 2011-12-05 MED ORDER — PROPOFOL 10 MG/ML IV EMUL
INTRAVENOUS | Status: DC | PRN
  Administered 2011-12-05: 250 mg via INTRAVENOUS

## 2011-12-05 MED ORDER — FENTANYL CITRATE 0.05 MG/ML IJ SOLN
INTRAMUSCULAR | Status: DC | PRN
  Administered 2011-12-05: 50 ug via INTRAVENOUS
  Administered 2011-12-05: 25 ug via INTRAVENOUS
  Administered 2011-12-05: 50 ug via INTRAVENOUS
  Administered 2011-12-05: 25 ug via INTRAVENOUS

## 2011-12-05 SURGICAL SUPPLY — 55 items
APPLIER CLIP 11 MED OPEN (CLIP)
BLADE SURG 10 STRL SS (BLADE) ×2 IMPLANT
BLADE SURG 15 STRL LF DISP TIS (BLADE) ×1 IMPLANT
BLADE SURG 15 STRL SS (BLADE) ×1
CANISTER SUCTION 1200CC (MISCELLANEOUS) ×2 IMPLANT
CHLORAPREP W/TINT 26ML (MISCELLANEOUS) ×2 IMPLANT
CLIP APPLIE 11 MED OPEN (CLIP) IMPLANT
CLOTH BEACON ORANGE TIMEOUT ST (SAFETY) ×2 IMPLANT
COVER MAYO STAND STRL (DRAPES) ×2 IMPLANT
COVER PROBE W GEL 5X96 (DRAPES) ×2 IMPLANT
COVER TABLE BACK 60X90 (DRAPES) ×2 IMPLANT
DECANTER SPIKE VIAL GLASS SM (MISCELLANEOUS) ×2 IMPLANT
DERMABOND ADVANCED (GAUZE/BANDAGES/DRESSINGS) ×2
DERMABOND ADVANCED .7 DNX12 (GAUZE/BANDAGES/DRESSINGS) ×2 IMPLANT
DEVICE DUBIN W/COMP PLATE 8390 (MISCELLANEOUS) ×2 IMPLANT
DRAIN CHANNEL 19F RND (DRAIN) IMPLANT
DRAPE LAPAROSCOPIC ABDOMINAL (DRAPES) ×2 IMPLANT
DRAPE UTILITY XL STRL (DRAPES) ×2 IMPLANT
ELECT COATED BLADE 2.86 ST (ELECTRODE) ×2 IMPLANT
ELECT REM PT RETURN 9FT ADLT (ELECTROSURGICAL) ×2
ELECTRODE REM PT RTRN 9FT ADLT (ELECTROSURGICAL) ×1 IMPLANT
EVACUATOR SILICONE 100CC (DRAIN) IMPLANT
GLOVE BIO SURGEON STRL SZ 6.5 (GLOVE) ×2 IMPLANT
GLOVE BIO SURGEON STRL SZ7.5 (GLOVE) ×2 IMPLANT
GLOVE BIOGEL M STRL SZ7.5 (GLOVE) ×2 IMPLANT
GLOVE BIOGEL PI IND STRL 8 (GLOVE) ×1 IMPLANT
GLOVE BIOGEL PI INDICATOR 8 (GLOVE) ×1
GOWN PREVENTION PLUS XLARGE (GOWN DISPOSABLE) ×4 IMPLANT
GOWN STRL REIN 2XL LVL4 (GOWN DISPOSABLE) ×2 IMPLANT
KIT MARKER MARGIN INK (KITS) IMPLANT
NDL SAFETY ECLIPSE 18X1.5 (NEEDLE) ×1 IMPLANT
NEEDLE HYPO 18GX1.5 SHARP (NEEDLE) ×1
NEEDLE HYPO 25X1 1.5 SAFETY (NEEDLE) ×4 IMPLANT
NS IRRIG 1000ML POUR BTL (IV SOLUTION) ×2 IMPLANT
PACK BASIN DAY SURGERY FS (CUSTOM PROCEDURE TRAY) ×2 IMPLANT
PENCIL BUTTON HOLSTER BLD 10FT (ELECTRODE) ×2 IMPLANT
PIN SAFETY STERILE (MISCELLANEOUS) IMPLANT
SLEEVE SCD COMPRESS KNEE MED (MISCELLANEOUS) ×2 IMPLANT
SPONGE LAP 18X18 X RAY DECT (DISPOSABLE) ×2 IMPLANT
SPONGE LAP 4X18 X RAY DECT (DISPOSABLE) ×2 IMPLANT
STAPLER VISISTAT 35W (STAPLE) IMPLANT
SUT ETHILON 3 0 FSL (SUTURE) IMPLANT
SUT MON AB 4-0 PC3 18 (SUTURE) ×6 IMPLANT
SUT SILK 3 0 PS 1 (SUTURE) IMPLANT
SUT VIC AB 3-0 54X BRD REEL (SUTURE) ×2 IMPLANT
SUT VIC AB 3-0 BRD 54 (SUTURE) ×2
SUT VIC AB 3-0 SH 27 (SUTURE) ×2
SUT VIC AB 3-0 SH 27X BRD (SUTURE) ×2 IMPLANT
SUT VICRYL 3-0 CR8 SH (SUTURE) ×2 IMPLANT
SYR CONTROL 10ML LL (SYRINGE) ×4 IMPLANT
TOWEL OR 17X24 6PK STRL BLUE (TOWEL DISPOSABLE) ×4 IMPLANT
TOWEL OR NON WOVEN STRL DISP B (DISPOSABLE) ×2 IMPLANT
TUBE CONNECTING 20X1/4 (TUBING) ×2 IMPLANT
WATER STERILE IRR 1000ML POUR (IV SOLUTION) ×2 IMPLANT
YANKAUER SUCT BULB TIP NO VENT (SUCTIONS) ×2 IMPLANT

## 2011-12-05 NOTE — H&P (Signed)
Heather Patton  Description:  66 year old female  11/02/2011 3:00 PM Office Visit Provider:  Robyne Askew, MD  MRN: [id number] Department:  [facility]            Diagnoses  Reason for Visit    Cancer of lower-inner quadrant of female breast - Primary  Breast Cancer   174.3            Vitals - Last Recorded       BP  Pulse  Temp  Ht  Wt  BMI    146/81  88  97.6 F (36.4 C)  5' 3.5" (1.613 m)  238 lb 11.2 oz (108.274 kg)  41.62 kg/m2             Progress Notes     Robyne Askew, MD 11/02/2011 3:04 PM Signed    Subjective:    Patient ID: Heather Patton, female DOB: [birth year], 66 y.o. MRN: [id number]  HPI  We are asked to see the patient in consultation by Dr. Anselmo Pickler to evaluate her for a left breast cancer. The patient is a 66 year old white female who recently underwent a screening mammogram. At that time she was not having any breast pain. She was not having discharge from her nipple on either side. Her mammogram identified a lesion in the lower inner quadrant of the left breast. By MRI this measured 7 mm. This was biopsied and came back as an invasive breast cancer. A second lesion was also identified by MRI 2.5 cm away. This is scheduled to be biopsied in the near future. She has otherwise been healthy. She has no complaints today. Her tumor is ER/PR positive and HER-2 negative.  Review of Systems  Constitutional: Negative.  HENT: Negative.  Eyes: Negative.  Respiratory: Negative.  Cardiovascular: Negative.  Gastrointestinal: Negative.  Genitourinary: Negative.  Musculoskeletal: Negative.  Skin: Negative.  Neurological: Negative.  Hematological: Negative.  Psychiatric/Behavioral: Negative.      Objective:    Physical Exam  Constitutional: She is oriented to person, place, and time. She appears well-developed and well-nourished.  HENT:  Head: Normocephalic and atraumatic.  Eyes: Conjunctivae and EOM are normal. Pupils  are equal, round, and reactive to light.  Neck: Normal range of motion. Neck supple.  Cardiovascular: Normal rate, regular rhythm and normal heart sounds.  Pulmonary/Chest: Effort normal and breath sounds normal.  She has no palpable mass in either breast. No axillary supraclavicular cervical lymphadenopathy.  Abdominal: Soft. Bowel sounds are normal. She exhibits no mass. There is no tenderness.  Musculoskeletal: Normal range of motion.  Lymphadenopathy:  She has no cervical adenopathy.  Neurological: She is alert and oriented to person, place, and time.  Skin: Skin is warm and dry.  Psychiatric: She has a normal mood and affect. Her behavior is normal.      Assessment:     The patient has a small invasive cancer in the lower inner quadrant of the left breast. She has a second area of concern nearby. If the second area is benign and she is a great candidate for breast conservation and would like to pursue this. If the second area is positive then she may still be a candidate for breast conservation given the generous size of her breast but it may be a little more complicated. She is trying to decide of the second area is positive whether she would want a mastectomy or not.  Plan:     She is scheduled for a biopsy of the secondary in the left breast in the next few days. We will call her with the results of her pathology. She will continue to consider her options so that she can make an informed decision once the second biopsy pathology is back              Not recorded                               Level of Service     PR OFFICE CONSULTATION,LEVEL IV [62130]           All Flowsheet Templates (all recorded)     Encounter Vitals Flowsheet   Custom Formula Data Flowsheet   Anthropometrics Flowsheet                                      All Charges for This Encounter       Code  Description  Service Date  Service Provider  Modifiers   Quantity    [phone]  PR OFFICE CONSULTATION,LEVEL IV  11/02/2011  Robyne Askew, MD   1                Other Encounter Related Information     Allergies & Medications      Problem List      History      Patient-Entered Questionnaires       No data filed

## 2011-12-05 NOTE — Op Note (Signed)
12/05/2011  11:59 AM  PATIENT:  Heather Patton  66 y.o. female  PRE-OPERATIVE DIAGNOSIS:  left breast cancer  POST-OPERATIVE DIAGNOSIS:  left breast cancer  PROCEDURE:  Procedure(s): BREAST LUMPECTOMY WITH SENTINEL LYMPH NODE BX  SURGEON:  Surgeon(s): Caleen Essex III, MD  PHYSICIAN ASSISTANT:   ASSISTANTS: none   ANESTHESIA:   general  EBL:  Total I/O In: 1600 [I.V.:1600] Out: -   BLOOD ADMINISTERED:none  DRAINS: none   LOCAL MEDICATIONS USED:  MARCAINE 20CC  SPECIMEN:  Source of Specimen:  left breast and sentinel nodes X 2  DISPOSITION OF SPECIMEN:  PATHOLOGY  COUNTS:  YES  TOURNIQUET:  * No tourniquets in log *  DICTATION: .Dragon Dictation After informed consent was obtained the patient was brought to the operating room and placed in the supine position on the operating room table. After adequate induction of general anesthesia the patient's left breast chest and axilla were all prepped with ChloraPrep, allowed to dry, and draped in usual sterile manner. Earlier in the day the patient underwent injection of 1 mCi technetium sulfur colloid in the subareolar position. Also earlier in the day the patient underwent localization of 2 areas with wires that were entering the left breast medially and headed laterally. At this 2 cc of methylene blue and 3 cc of injectable saline were also injected in the subareolar position and the breast was massaged for several minutes.attention was first turned to the left axilla. A transversely oriented incision was made overlying a hot spot as determined using the neoprobe. This incision was carried down through the skin and subcutaneous tissue sharply with electrocautery until the axilla was entered. A [place] retractor was deployed.the neoprobe was used to direct the dissection. Using a combination of blunt hemostat dissection and some sharp dissection and electrocautery to hot blue lymph nodes were identified. They were excised sharply  with the electrocautery. Ex vivo counts on the sentinel modes were approximately 2000. These were sent for further evaluation to pathology. No other hot blue or palpable lymph nodes were identified in the left axilla. The deep layer the wound was closed interrupted 3-0 Vicryl stitches. The skin was closed a running 4-0 Monocryl subcuticular stitch. Attention was then turned to the left breast. A medial curvilinear incision was made on the medial portion of the breast overlying the path of the wires. This was carried down through the skin and subcutaneous tissue sharply with electrocautery. Once into the breast tissue the path of the wires could be palpated. A circular portion of breast tissue was excised sharply around the path of the wires. This dissection was carried all of the chest wall. Once the specimen was removed it was oriented according to the assigned paint colors.a specimen radiograph was obtained that showed both clips to be in the specimen. It was then sent to pathology for further evaluation. Hemostasis was achieved achieved using the Bovie electrocautery. The wound is infiltrated with quarter percent Marcaine. The deep layer the wound was closed with interrupted 3 marked stitches. The skin was closed with interrupted 4-0 Vicryl subcuticular stitches. Dermabond dressings were then applied. At the end of the distal needle sponge and instrument counts are correct. Patient tolerated procedure well. The patient was awakened and taken to recovery in stable condition.  PLAN OF CARE: Discharge to home after PACU  PATIENT DISPOSITION:  PACU - hemodynamically stable.   Delay start of Pharmacological VTE agent (>24hrs) due to surgical blood loss or risk of bleeding:  {YES/NO/NOT APPLICABLE:20182

## 2011-12-05 NOTE — Anesthesia Postprocedure Evaluation (Signed)
Anesthesia Post Note  Patient: Heather Patton  Procedure(s) Performed:  BREAST LUMPECTOMY WITH SENTINEL LYMPH NODE BX - left breast needle localizaton lumpectomy and sentinel node biopsy  Anesthesia type: General  Patient location: PACU  Post pain: Pain level controlled  Post assessment: Patient's Cardiovascular Status Stable  Last Vitals:  Filed Vitals:   12/05/11 1300  BP: 131/80  Pulse: 83  Temp:   Resp: 16    Post vital signs: Reviewed and stable  Level of consciousness: alert  Complications: No apparent anesthesia complications

## 2011-12-05 NOTE — Anesthesia Preprocedure Evaluation (Signed)
Anesthesia Evaluation  Patient identified by MRN, date of birth, ID band Patient awake    Reviewed: Allergy & Precautions, H&P , NPO status , Patient's Chart, lab work & pertinent test results, reviewed documented beta blocker date and time   Airway Mallampati: II TM Distance: >3 FB Neck ROM: full    Dental   Pulmonary sleep apnea ,          Cardiovascular neg cardio ROS     Neuro/Psych Negative Neurological ROS  Negative Psych ROS   GI/Hepatic negative GI ROS, Neg liver ROS, GERD-  Medicated and Controlled,  Endo/Other  Hypothyroidism Morbid obesity  Renal/GU negative Renal ROS  Genitourinary negative   Musculoskeletal   Abdominal   Peds  Hematology negative hematology ROS (+)   Anesthesia Other Findings See surgeon's H&P   Reproductive/Obstetrics negative OB ROS                           Anesthesia Physical Anesthesia Plan  ASA: III  Anesthesia Plan: General   Post-op Pain Management:    Induction: Intravenous  Airway Management Planned: LMA  Additional Equipment:   Intra-op Plan:   Post-operative Plan: Extubation in OR  Informed Consent: I have reviewed the patients History and Physical, chart, labs and discussed the procedure including the risks, benefits and alternatives for the proposed anesthesia with the patient or authorized representative who has indicated his/her understanding and acceptance.     Plan Discussed with: CRNA and Surgeon  Anesthesia Plan Comments:         Anesthesia Quick Evaluation

## 2011-12-05 NOTE — Transfer of Care (Signed)
Immediate Anesthesia Transfer of Care Note  Patient: Heather Patton  Procedure(s) Performed:  BREAST LUMPECTOMY WITH SENTINEL LYMPH NODE BX - left breast needle localizaton lumpectomy and sentinel node biopsy  Patient Location: PACU  Anesthesia Type: General  Level of Consciousness: awake, alert  and oriented  Airway & Oxygen Therapy: Patient Spontanous Breathing and Patient connected to face mask oxygen  Post-op Assessment: Report given to PACU RN and Post -op Vital signs reviewed and stable  Post vital signs: Reviewed and stable Filed Vitals:   12/05/11 1200  BP: 127/76  Pulse: 114  Temp: 36.4 C  Resp: 17    Complications: No apparent anesthesia complications

## 2011-12-05 NOTE — Anesthesia Procedure Notes (Signed)
Procedure Name: LMA Insertion Date/Time: 12/05/2011 10:14 AM Performed by: Zenia Resides D Pre-anesthesia Checklist: Patient identified, Emergency Drugs available, Suction available and Patient being monitored Patient Re-evaluated:Patient Re-evaluated prior to inductionOxygen Delivery Method: Circle System Utilized Preoxygenation: Pre-oxygenation with 100% oxygen Intubation Type: IV induction Ventilation: Mask ventilation without difficulty LMA: LMA with gastric port inserted LMA Size: 4.0 Number of attempts: 1 Placement Confirmation: positive ETCO2 and breath sounds checked- equal and bilateral Tube secured with: Tape Dental Injury: Teeth and Oropharynx as per pre-operative assessment

## 2011-12-05 NOTE — Interval H&P Note (Signed)
History and Physical Interval Note:  12/05/2011 9:45 AM  Heather Patton  has presented today for surgery, with the diagnosis of left breast cancer  The various methods of treatment have been discussed with the patient and family. After consideration of risks, benefits and other options for treatment, the patient has consented to  Procedure(s): BREAST LUMPECTOMY WITH SENTINEL LYMPH NODE BX as a surgical intervention .  The patients' history has been reviewed, patient examined, no change in status, stable for surgery.  I have reviewed the patients' chart and labs.  Questions were answered to the patient's satisfaction.     TOTH III,Sharlotte Baka S

## 2011-12-09 ENCOUNTER — Telehealth (INDEPENDENT_AMBULATORY_CARE_PROVIDER_SITE_OTHER): Payer: Self-pay | Admitting: General Surgery

## 2011-12-09 NOTE — Telephone Encounter (Signed)
PATHOLOGY RESULTS REVIEWED WITH PT/ HAS F/U APPT WITH DR. TOTH/GY

## 2011-12-09 NOTE — Telephone Encounter (Signed)
PT NOTIFIED OF PATHOLOGY RESULTS./ GY

## 2011-12-22 ENCOUNTER — Ambulatory Visit (INDEPENDENT_AMBULATORY_CARE_PROVIDER_SITE_OTHER): Payer: BC Managed Care – PPO | Admitting: General Surgery

## 2011-12-22 ENCOUNTER — Encounter (INDEPENDENT_AMBULATORY_CARE_PROVIDER_SITE_OTHER): Payer: Self-pay | Admitting: General Surgery

## 2011-12-22 VITALS — BP 142/72 | HR 81 | Temp 98.4°F | Ht 64.0 in | Wt 240.0 lb

## 2011-12-22 DIAGNOSIS — C50319 Malignant neoplasm of lower-inner quadrant of unspecified female breast: Secondary | ICD-10-CM

## 2011-12-22 NOTE — Progress Notes (Signed)
Subjective:     Patient ID: Heather Patton, female   DOB: [birth year], 66 y.o.   MRN: [id number]  HPI The patient is a 66 year old white female who is now about 2 weeks out from a left breast lumpectomy and negative sentinel node biopsy. She has done well since surgery. Her only complaint is of some occasional sharp stinging pain that come and go.  Review of Systems     Objective:   Physical Exam On exam her left breast and axillary incisions are healing nicely. There is no sign of infection.    Assessment:     Status post left breast lumpectomy and negative sentinel node biopsy    Plan:     At this point I think she seems to be healing nicely. She has appointments with the medical and radiation oncologist next week. We will plan to see her back in one month.

## 2012-01-02 ENCOUNTER — Ambulatory Visit (HOSPITAL_BASED_OUTPATIENT_CLINIC_OR_DEPARTMENT_OTHER): Payer: BC Managed Care – PPO | Admitting: Oncology

## 2012-01-02 ENCOUNTER — Ambulatory Visit
Admission: RE | Admit: 2012-01-02 | Discharge: 2012-01-02 | Disposition: A | Discharge status: Home / Self Care | Payer: BC Managed Care – PPO | Source: Ambulatory Visit | Attending: Radiation Oncology | Admitting: Radiation Oncology

## 2012-01-02 ENCOUNTER — Telehealth: Payer: Self-pay | Admitting: Oncology

## 2012-01-02 ENCOUNTER — Other Ambulatory Visit: Payer: BC Managed Care – PPO | Admitting: Lab

## 2012-01-02 VITALS — BP 137/82 | HR 79 | Temp 98.6°F | Wt 238.2 lb

## 2012-01-02 VITALS — BP 137/82 | HR 79 | Temp 98.6°F | Ht 64.0 in | Wt 238.8 lb

## 2012-01-02 DIAGNOSIS — C50912 Malignant neoplasm of unspecified site of left female breast: Secondary | ICD-10-CM

## 2012-01-02 DIAGNOSIS — C50919 Malignant neoplasm of unspecified site of unspecified female breast: Secondary | ICD-10-CM

## 2012-01-02 DIAGNOSIS — C50319 Malignant neoplasm of lower-inner quadrant of unspecified female breast: Secondary | ICD-10-CM

## 2012-01-02 LAB — CBC WITH DIFFERENTIAL/PLATELET
BASO%: 0.1 % (ref 0.0–2.0)
Eosinophils Absolute: 0.2 10*3/uL (ref 0.0–0.5)
LYMPH%: 23.2 % (ref 14.0–49.7)
MCH: 29.4 pg (ref 25.1–34.0)
MCV: 85.2 fL (ref 79.5–101.0)
MONO%: 6.4 % (ref 0.0–14.0)
NEUT#: 5.3 10*3/uL (ref 1.5–6.5)
Platelets: 253 10*3/uL (ref 145–400)
RBC: 5.03 10*6/uL (ref 3.70–5.45)
RDW: 13.6 % (ref 11.2–14.5)

## 2012-01-02 MED ORDER — LETROZOLE 2.5 MG PO TABS: 2.5000 mg | ORAL_TABLET | Freq: Every day | ORAL | Status: AC

## 2012-01-02 NOTE — Progress Notes (Signed)
CC:   Heather Patton, Ph.D., M.D. Heather Patton. Heather Patton, M.D. Heather Patton, M.D. Heather Patton. Heather Patton, M.D.  REFERRING PHYSICIAN:  Ollen Patton. Heather Patton, M.D.  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  HISTORY OF PRESENT ILLNESS:  Heather Patton is a very pleasant 66 year old female who is seen out of courtesy of Dr. Carolynne Patton for additional evaluation as it relates to her left breast cancer.  Heather Patton was seen in consultation by myself on November 05, 2011 as part of the Multidisciplinary Breast Clinic.  The patient proceeded to undergo her definitive surgery on December 05, 2011 with partial mastectomy and sentinel node procedure.  The patient was found have 2 benign lymph nodes within the left axilla.  Within the left breast, the patient was found have a 1.0 cm invasive ductal carcinoma, grade 2/3.  The tumor was estrogen receptor positive at 99% and progesterone receptor positive at 94%.  There was no HER-2/neu amplification.  Ki-67 was 15%.  The patient did see Heather Patton, and given the patient's low risk for recurrence, adjuvant chemotherapy was not recommended.  It was recommended that the patient proceed with adjuvant hormonal therapy after radiation therapy. The patient is now seen for coordination of her radiation therapy as part of breast conserving therapy.  REVIEW OF SYSTEMS:  The patient has minimal discomfort in the left breast.  She only took her prescription pain medication 1 day.  The patient denies any chills or fever.  She denies any drainage from the left breast.  She denies any problems with swelling in her left arm or hand.  PHYSICAL EXAMINATION:  The patient's weight is 238 pounds, temperature is 98.6, pulse 79, blood pressure is 137/82.  Examination of the neck and supraclavicular region reveals no evidence of adenopathy.  The axillary areas are free of adenopathy.  Examination of the lungs reveals them to be clear.  The heart has  a regular rhythm and rate.  Examination of the right breast reveals no palpable masses, nipple discharge or bleeding.  Examination of the left breast reveals a well-healed scar in the upper inner aspect of breast adjacent to the areolar border.  There is a second scar in the axillary region from her sentinel node procedure.  There are no signs of drainage or infection in the breast or dominant mass.  IMPRESSION/PLAN:  Stage I invasive ductal carcinoma of the left breast. The patient would be an excellent candidate for breast conservation with radiation therapy directed at the left breast area.  I discussed the overall treatment course, side effects and potential toxicities of radiation therapy in this situation with Ms. Patton.  The patient appears to understand and wishes to proceed with the planned course of treatment.  The patient has healed well from her most recent surgery and will return tomorrow for CT simulation with treatments to begin next week.  I anticipate approximately 28 treatments directed at the whole breast.  The patient will then proceed with a boost directed at the site of presentation to a cumulative dose of approximately 6300 cGy in light of the close inferior margin.    ______________________________ Heather Patton, Ph.D., M.D. JDK/MEDQ  D:  01/02/2012  T:  01/02/2012  Job:  [id number]

## 2012-01-02 NOTE — Progress Notes (Signed)
Please see the Nurse Progress Note in the MD Initial Consult Encounter for this patient. 

## 2012-01-02 NOTE — Telephone Encounter (Signed)
gve the pt her June 2013 appt calendar 

## 2012-01-02 NOTE — Progress Notes (Signed)
Here for consultation for consideration of radiation for breast cancer. Patient won't be having chemotherapy. ERPR positive. Atypical lobular hyperplasia. Menarche age 67 Menopause in 53 HRT x 10 years stopped in 2006.

## 2012-01-02 NOTE — Progress Notes (Signed)
ID: SULA FETTERLY  DOB: [birth year]  MR#: [id number]  CSN#: [id number]  HPI: The patient is a 66 year old [place] woman who had routine screening mammography at the breast center 09/21/2011. A possible mass was noted in the left breast, and she was recalled for additional views November 21, 29012. Diagnostic mammography showed a low density spiculated mass in the lower outer quadrant of the left breast measuring 6 mm. This was not palpable to the mammographer. Ultrasound showed only normal tissue in the area in question.   Stereotactic biopsy was performed 10/21/2011 and showed [phone] an invasive carcinoma with lobular features grade 1 or 2, with no her 2 amplification, 99% estrogen receptor positive, 94% progesterone receptor positive, with an MIB-1-1 of 15%.   With this information the patient underwent bilateral breast MRI 10/28/2011. A post biopsy hematoma was noted in the lower inner aspect of the left breast, with a small area of masslike enhancement associated with it. This area measured 7 mm. The MRI showed a second irregularly marginated mass in the central portion of the left breast 2.6 cm away from the biopsied mass. The second mass measured 5 mm maximally. There was no abnormality in the right breast and no adenopathy noted.  With this information the patient was evaluated at the multidisciplinary breast clinic on 11/02/2011 and underwent definitive surgery 12/05/2011.   Interval History:   The patient returns today with her husband Elijah Birk for followup of her breast cancer. She had her definitive surgery under Dr. Carolynne Edouard 12/05/2011. The final results are noted below, but I will mention that the second, 5 mm area of concern in her left breast was included in the original biopsy and was found to be benign.  ROS:  She tolerated the surgery well, with a little bit "stinging" initially, which has resolved. There was no bleeding, fever, dehiscence, erythema, or unusual swelling. A detailed  review of systems is otherwise noncontributory. She is not having hot flashes at present. She has moderate vaginal dryness at baseline. She is not walking regularly but does walk 40-45 minutes perhaps once or twice a week when she does get around to it  No Known Allergies  Current Outpatient Prescriptions  Medication Sig Dispense Refill  . calcium citrate-vitamin D (CITRACAL+D) 315-200 MG-UNIT per tablet Take 1 tablet by mouth daily. Calcium citrate 630mg     Vitamin D  500iu       . docusate sodium (COLACE) 100 MG capsule Take 100 mg by mouth 2 (two) times daily.        Marland Kitchen ezetimibe-simvastatin (VYTORIN) 10-80 MG per tablet Take 1 tablet by mouth at bedtime.        Marland Kitchen nystatin-triamcinolone (MYCOLOG II) cream       . HYDROcodone-acetaminophen (NORCO) 5-325 MG per tablet       . levothyroxine (SYNTHROID, LEVOTHROID) 150 MCG tablet Take 150 mcg by mouth daily.         Past medical history:  Past Medical History   Diagnosis  Date   .  Degenerative joint disease    .  GERD (gastroesophageal reflux disease)    .  Osteopenia    .  Sleep apnea    .  Hx of colonic polyps    .  Hypothyroidism    .  Hyperlipidemia     Past surgical history:  Past Surgical History   Procedure  Date   .  Tonsillectomy    .  Abdominal hysterectomy    .  Cervical disc surgery  Family history:  Family History   Problem  Relation  Age of Onset   .  Cancer  Mother    .  Cancer  Cousin    the patient's father died at the age of 77 from a myocardial infarction. The patient's mother was diagnosed with breast cancer at age 44, but she died at age 72 following a fall. The patient had no brothers, has one sister. The only other breast cancer diagnosis in the family was a cousin on the maternal side. The patient does not know at what age she was diagnosed. There is no history of ovarian cancer in the family.   GYN history: GX P0. Menarche age 14, menopause 35 she took hormone replacement for about 10 years,  discontinuing that in 2006.   Social history: She is a retired [employer] officer. Her husband Elijah Birk  is retired from the [employer]. The patient is a close friend of one of my patients, Lavilla Delamora.  Objective:  Filed Vitals:   01/02/12 0911  BP: 137/82  Pulse: 79  Temp: 98.6 F (37 C)    BMI: Body mass index is 40.99 kg/(m^2).   ECOG FS: 0  Physical Exam:   Sclerae unicteric  Oropharynx clear  No peripheral adenopathy, and specifically the left axilla is clear  Lungs clear -- no rales or rhonchi  Heart regular rate and rhythm  Abdomen benign  MSK no focal spinal tenderness, no peripheral edema  Neuro nonfocal  Breast exam: Right breast, unremarkable. Left breast, shows the scars in the breast and axilla to be healing very nicely, with minimal induration under the breast scar, but no erythema, dehiscence, or unusual swelling. There are no skin changes or nipple retraction  Lab Results:      Chemistry      Component Value Date/Time   NA 140 11/02/2011 1219   K 3.7 11/02/2011 1219   CL 104 11/02/2011 1219   CO2 26 11/02/2011 1219   BUN 13 11/02/2011 1219   CREATININE 0.69 11/02/2011 1219      Component Value Date/Time   CALCIUM 9.9 11/02/2011 1219   ALKPHOS 92 11/02/2011 1219   AST 20 11/02/2011 1219   ALT 23 11/02/2011 1219   BILITOT 0.4 11/02/2011 1219       Lab Results  Component Value Date   WBC 7.1 11/02/2011   HGB 14.4 11/02/2011   HCT 43.2 11/02/2011   MCV 86.3 11/02/2011   PLT 245 11/02/2011   NEUTROABS 5.1 11/02/2011    Studies/Results:  Mm Breast Wire Localization Left  12/05/2011  *RADIOLOGY REPORT*  Clinical Data:  Stereotactic needle biopsy 10/21/2011 revealing invasive lobular carcinoma in the lower inner left breast.  MRI needle biopsy 11/07/2011 revealing atypical lobular hyperplasia in the central left breast.  NEEDLE LOCALIZATION WITH MAMMOGRAPHIC GUIDANCE (x 2) AND SPECIMEN RADIOGRAPH (x 1) 12/05/2011:  Patient presents for needle  localization prior to lumpectomy.  I met with the patient and we discussed the procedure of needle localization including benefits and alternatives. We discussed the high likelihood of a successful procedure. We discussed the risks of the procedure, including infection, bleeding, tissue injury, and further surgery. Informed, written consent was given.  Using mammographic guidance, sterile technique, 2% lidocaine, and a 7 cm modified Kopans needle, the invasive lobular carcinoma in the lower inner left breast was localized using a medial approach. Subsequently, again using mammographic guidance, sterile technique, 2% lidocaine, and and 11 cm modified Kopans needle, the atypical lobular hyperplasia in  the deep central left breast was localized using a medial approach.  The films are marked for Dr. Carolynne Edouard.  Specimen radiograph was performed at [facility], and confirms that both mammographic clips and the density associated with the invasive lobular carcinoma are present in the tissue sample.  The specimen is marked for pathology.  IMPRESSION: Needle localization left breast.  No apparent complications.  Original Report Authenticated By: Arnell Sieving, M.D.    Assessment: 66 year old [place] woman status post left lumpectomy and sentinel lymph node biopsy 12/05/2011 for a T1BN0, stage IA invasive ductal carcinoma (e-cadherin positive), grade 2, estrogen and progesterone receptor positive at 99% and 94% respectively, HER-2 not amplified, with an MIB-1 of 15%.    Plan: While NCCN guidelines suggest sending an Oncotype essentially in all patients who are node-negative and ER positive, I don't think we need that in this patient's case in order to make a definitive decision for or against chemotherapy. Her prognosis according to the Adjuvant! Program is excellent, with a risk of death with local treatment only in the 3% range, and a risk of recurrence within 10 years of 17%, again with surgery and radiation  alone.  We discussed anti-estrogen therapy, and this will essentially cut her risk of recurrence in half, to about 8%. We talked about the possible additional risk reduction from chemotherapy, being somewhere between 1 and 2%. We generally do not recommend chemotherapy unless the benefit to patients in the group in question is 5% or greater.  Accordingly she will see Dr. Roselind Messier later today to discuss radiaiton treatments. When she completes that she will start anti-estrogens, and we talked about the difference between tamoxifen and the aromatase inhibitors. She has a good understanding of the possible toxicities, side effects, and complications of these drugs and she will start letrozole right after completion of radiation which I anticipate will be sometime in early April. She will then return to see me mid-June. If she is tolerating the letrozole well, the plan will be to continue that for 5 years, repeating a bone density perhaps a year or so after she starts the drug  Incidentally her baseline studies included a CA 27-29 which was non-informative and so will not be repeated. She had a glucose of 181, nonfasting, and she will discuss this further with her primary care physician, Dr. [name], at her next visit with him, which I believe is coming up shortly.    Shamyah Stantz C 01/02/2012

## 2012-01-03 ENCOUNTER — Ambulatory Visit
Admission: RE | Admit: 2012-01-03 | Discharge: 2012-01-03 | Disposition: A | Discharge status: Home / Self Care | Payer: BC Managed Care – PPO | Source: Ambulatory Visit | Attending: Radiation Oncology | Admitting: Radiation Oncology

## 2012-01-03 DIAGNOSIS — C50912 Malignant neoplasm of unspecified site of left female breast: Secondary | ICD-10-CM

## 2012-01-03 NOTE — Progress Notes (Signed)
DIAGNOSIS:  Left breast cancer.  NARRATIVE:  Earlier today Heather Patton underwent treatment planning to begin radiation therapy directed at the left breast area.  The patient was placed on the CT simulator table.  A custom AccuForm mold was placed for positioning of the neck and the head for treatment.  The patient was placed in the breast Quest multipositioning device.  The patient then had a wire placed along the lumpectomy cavity in the upper-inner aspect of the breast.  She also had wires outlining the extent of the breast tissue.  The patient then proceeded to undergo CT scan in the treatment position.  Under virtual simulation, the lumpectomy cavity, lumpectomy scar, and nipple area was outlined periods.  The patient then had set-up of 2 tangential beams encompassing the left breast.  Forward planning will be used to improve the dose homogeneity.  A computerized isodose plan will be generated for treatment.  TREATMENT PLAN:  The patient is to proceed with daily radiation treatments at 180 cGy per day for 28 treatments for a dose to the whole breast of 5040 cGy.  The patient will then proceed with additional planning and continue with a boost field directed at the site of presentation in the upper inner aspect of the breast and continue to a cumulative dose of 6300 cGy.  Given the depth of the tumor within the breast, the patient will be treated with a reduced field photon beam arrangement for her boost field.  The patient will likely be treated with a combination of 6 and 18 MV photons in light of the patient's breast size.    ______________________________ Heather Patton, Ph.D., M.D. JDK/MEDQ  D:  01/03/2012  T:  01/03/2012  Job:  [id number]

## 2012-01-03 NOTE — Progress Notes (Signed)
Met with patient to discuss RO billing.  Patient had no concerns today. 

## 2012-01-10 ENCOUNTER — Ambulatory Visit
Admission: RE | Admit: 2012-01-10 | Discharge: 2012-01-10 | Disposition: A | Discharge status: Home / Self Care | Payer: BC Managed Care – PPO | Source: Ambulatory Visit | Attending: Radiation Oncology | Admitting: Radiation Oncology

## 2012-01-10 DIAGNOSIS — C50912 Malignant neoplasm of unspecified site of left female breast: Secondary | ICD-10-CM

## 2012-01-10 NOTE — Progress Notes (Signed)
[facility] Radiation Oncology Simulation Verification Note   Name: Heather Patton MRN: [id number]   Date: @T @  DOB: 05/02/1946  Status:outpatient   DIAGNOSIS:  1. Breast cancer, left breast     POSITION: Patient was placed in the supine position on the treatment machine.  Isocenter and MLCS were reviewed and treatment was approved.  NARRATIVE: Patient tolerated simulation well.

## 2012-01-11 ENCOUNTER — Ambulatory Visit
Admission: RE | Admit: 2012-01-11 | Discharge: 2012-01-11 | Disposition: A | Discharge status: Home / Self Care | Payer: BC Managed Care – PPO | Source: Ambulatory Visit | Attending: Radiation Oncology | Admitting: Radiation Oncology

## 2012-01-12 ENCOUNTER — Ambulatory Visit
Admission: RE | Admit: 2012-01-12 | Discharge: 2012-01-12 | Disposition: A | Discharge status: Home / Self Care | Payer: BC Managed Care – PPO | Source: Ambulatory Visit | Attending: Radiation Oncology | Admitting: Radiation Oncology

## 2012-01-12 DIAGNOSIS — C50912 Malignant neoplasm of unspecified site of left female breast: Secondary | ICD-10-CM

## 2012-01-12 DIAGNOSIS — C50319 Malignant neoplasm of lower-inner quadrant of unspecified female breast: Secondary | ICD-10-CM

## 2012-01-12 MED ORDER — ALRA NON-METALLIC DEODORANT (RAD-ONC)
1.0000 "application " | Freq: Once | TOPICAL | Status: AC
  Administered 2012-01-12: 1 via TOPICAL

## 2012-01-12 MED ORDER — RADIAPLEXRX EX GEL
Freq: Once | CUTANEOUS | Status: AC
  Administered 2012-01-12: 14:00:00 via TOPICAL

## 2012-01-12 NOTE — Progress Notes (Signed)
Post sim teaching, radiation therapy and you booklet, alra deodorant,radiaplex gel tube given to pt with flyer on skin care products, reviewed side effects, symptoms to report, will see MD weekly/prn, pt gave verbal understanding, all questions answered, printed schedule calender and given to pt also 2:43 PM

## 2012-01-13 ENCOUNTER — Ambulatory Visit
Admission: RE | Admit: 2012-01-13 | Discharge: 2012-01-13 | Disposition: A | Discharge status: Home / Self Care | Payer: BC Managed Care – PPO | Source: Ambulatory Visit | Attending: Radiation Oncology | Admitting: Radiation Oncology

## 2012-01-16 ENCOUNTER — Ambulatory Visit
Admission: RE | Admit: 2012-01-16 | Discharge: 2012-01-16 | Disposition: A | Discharge status: Home / Self Care | Payer: BC Managed Care – PPO | Source: Ambulatory Visit | Attending: Radiation Oncology | Admitting: Radiation Oncology

## 2012-01-17 ENCOUNTER — Ambulatory Visit
Admission: RE | Admit: 2012-01-17 | Discharge: 2012-01-17 | Disposition: A | Discharge status: Home / Self Care | Payer: BC Managed Care – PPO | Source: Ambulatory Visit | Attending: Radiation Oncology | Admitting: Radiation Oncology

## 2012-01-17 ENCOUNTER — Encounter: Payer: Self-pay | Admitting: Radiation Oncology

## 2012-01-17 DIAGNOSIS — C50912 Malignant neoplasm of unspecified site of left female breast: Secondary | ICD-10-CM

## 2012-01-17 NOTE — Progress Notes (Signed)
[facility]    Radiation Oncology [address] [place]     Maryln Gottron, M.D. [place], [place] [address]               Billie Lade, M.D., Ph.D. Phone: [phone]      Molli Hazard A. Kathrynn Running, M.D. Fax: [phone]      Radene Gunning, M.D., Ph.D.         Lurline Hare, M.D.         Grayland Jack, M.D Weekly Treatment Management Note  Name: Heather Patton     MRN: [id number]        CSN: [id number] Date: 01/17/2012      DOB: 01/17/1946  CC: Julian Hy, MD, MD         [facility]    Status: Outpatient  Diagnosis: The encounter diagnosis was Breast cancer, left breast.  Current Dose: 900  Current Fraction: 5  Planned Dose: 6300 c Gy  Narrative: [name] was seen today for weekly treatment management. The chart was checked and port films  were reviewed. Pt tolerating tx well without side effects.  Review of patient's allergies indicates no known allergies.   Current Outpatient Prescriptions  Medication Sig Dispense Refill  . calcium citrate-vitamin D (CITRACAL+D) 315-200 MG-UNIT per tablet Take 1 tablet by mouth daily. Calcium citrate 630mg     Vitamin D  500iu       . docusate sodium (COLACE) 100 MG capsule Take 100 mg by mouth 2 (two) times daily.        Marland Kitchen ezetimibe-simvastatin (VYTORIN) 10-80 MG per tablet Take 1 tablet by mouth at bedtime.        . hydrochlorothiazide (HYDRODIURIL) 25 MG tablet       . HYDROcodone-acetaminophen (NORCO) 5-325 MG per tablet       . letrozole (FEMARA) 2.5 MG tablet Take 1 tablet (2.5 mg total) by mouth daily.  30 tablet  12  . levothyroxine (SYNTHROID, LEVOTHROID) 150 MCG tablet Take 150 mcg by mouth daily.        Marland Kitchen nystatin-triamcinolone (MYCOLOG II) cream        Labs:  Lab Results  Component Value Date   WBC 7.7 01/02/2012   HGB 14.8 01/02/2012   HCT 42.9 01/02/2012   MCV 85.2 01/02/2012   PLT 253 01/02/2012   Lab Results  Component Value Date   CREATININE 0.69 11/02/2011   BUN 13 11/02/2011   NA 140  11/02/2011   K 3.7 11/02/2011   CL 104 11/02/2011   CO2 26 11/02/2011   Lab Results  Component Value Date   ALT 23 11/02/2011   AST 20 11/02/2011   BILITOT 0.4 11/02/2011    Physical Examination:  vitals were not taken for this visit.   Wt Readings from Last 3 Encounters:  01/02/12 238 lb 3.2 oz (108.047 kg)  01/02/12 238 lb 12.8 oz (108.319 kg)  12/22/11 240 lb (108.863 kg)     Lungs - Normal respiratory effort, chest expands symmetrically. Lungs are clear to auscultation, no crackles or wheezes.  Heart has regular rhythm and rate  Abdomen is soft and non tender with normal bowel sounds Breast Assessment:  Patient tolerating treatments well  Plan: Continue treatment per original radiation prescription

## 2012-01-17 NOTE — Progress Notes (Signed)
DIAGNOSIS:  Left breast cancer.  NARRATIVE:  Heather Patton is seen today for weekly assessment.  She has completed 5/35 planned treatments directed at the left breast area (900 cGy of a planned 6300 cGy).  The patient is tolerating her treatments well at this time without any appreciable itching or discomfort in the treatment area or fatigue.  EXAMINATION:  The patient's weight today is 242.2 pounds.  The neck and supraclavicular regions are free of adenopathy.  The lungs are clear. The heart has a regular rhythm and rate.  Examination of the left breast reveals minimal erythema at this time.  There are no signs of infection in the breast area.  IMPRESSION AND PLAN:  The patient is tolerating her radiation treatments well at this time.  The patient's radiation fields are setting up accurately.  The patient's radiation chart was checked today.  Plan is to continue to a cumulative dose of 6300 cGy as part of breast conservation therapy.    ______________________________ Billie Lade, Ph.D., M.D. JDK/MEDQ  D:  01/17/2012  T:  01/17/2012  Job:  [id number]

## 2012-01-17 NOTE — Progress Notes (Signed)
Here for routine weekly MD visit.Has completed 5/35 treatments to left breast. No observable skin changes. No questions or concerns.

## 2012-01-18 ENCOUNTER — Ambulatory Visit
Admission: RE | Admit: 2012-01-18 | Discharge: 2012-01-18 | Disposition: A | Discharge status: Home / Self Care | Payer: BC Managed Care – PPO | Source: Ambulatory Visit | Attending: Radiation Oncology | Admitting: Radiation Oncology

## 2012-01-19 ENCOUNTER — Ambulatory Visit
Admission: RE | Admit: 2012-01-19 | Discharge: 2012-01-19 | Disposition: A | Discharge status: Home / Self Care | Payer: BC Managed Care – PPO | Source: Ambulatory Visit | Attending: Radiation Oncology | Admitting: Radiation Oncology

## 2012-01-20 ENCOUNTER — Ambulatory Visit
Admission: RE | Admit: 2012-01-20 | Discharge: 2012-01-20 | Disposition: A | Discharge status: Home / Self Care | Payer: BC Managed Care – PPO | Source: Ambulatory Visit | Attending: Radiation Oncology | Admitting: Radiation Oncology

## 2012-01-23 ENCOUNTER — Ambulatory Visit
Admission: RE | Admit: 2012-01-23 | Discharge: 2012-01-23 | Disposition: A | Discharge status: Home / Self Care | Payer: BC Managed Care – PPO | Source: Ambulatory Visit | Attending: Radiation Oncology | Admitting: Radiation Oncology

## 2012-01-23 ENCOUNTER — Encounter (INDEPENDENT_AMBULATORY_CARE_PROVIDER_SITE_OTHER): Payer: BC Managed Care – PPO | Admitting: General Surgery

## 2012-01-24 ENCOUNTER — Encounter: Payer: Self-pay | Admitting: Radiation Oncology

## 2012-01-24 ENCOUNTER — Ambulatory Visit
Admission: RE | Admit: 2012-01-24 | Discharge: 2012-01-24 | Disposition: A | Discharge status: Home / Self Care | Payer: BC Managed Care – PPO | Source: Ambulatory Visit | Attending: Radiation Oncology | Admitting: Radiation Oncology

## 2012-01-24 VITALS — Wt 241.6 lb

## 2012-01-24 DIAGNOSIS — C50912 Malignant neoplasm of unspecified site of left female breast: Secondary | ICD-10-CM

## 2012-01-24 NOTE — Progress Notes (Signed)
DIAGNOSIS:  Left breast cancer.  NARRATIVE:  Heather Patton is seen today for weekly assessment.  She has completed 1800 cGy of a planned 6300 cGy directed at the left breast area.  The patient has noticed some mild sensitivity to the lateral aspect of the breast area but no other issues at this time.  PHYSICAL EXAMINATION:  Lungs:  Clear.  Heart:  Has a regular rhythm and rate.  Examination of the left breast reveals some mild erythema but no significant reaction at this point.  IMPRESSION/PLAN:  The patient is tolerating her radiation treatments well at this time.  The patient's radiation fields are setting up accurately.  The patient's radiation chart was checked today.  Plan is to continue with breast conservation therapy to a cumulative dose of 6300 cGy.    ______________________________ Heather Patton, Ph.D., M.D. JDK/MEDQ  D:  01/24/2012  T:  01/24/2012  Job:  [id number]

## 2012-01-24 NOTE — Progress Notes (Signed)
Here for routine weekly radiation under treat visit with MD.Has completed 10 of 28 whole  treatments to left breast. Skin pink with early stage follicular reaction in mid chest.

## 2012-01-25 ENCOUNTER — Ambulatory Visit
Admission: RE | Admit: 2012-01-25 | Discharge: 2012-01-25 | Disposition: A | Discharge status: Home / Self Care | Payer: BC Managed Care – PPO | Source: Ambulatory Visit | Attending: Radiation Oncology | Admitting: Radiation Oncology

## 2012-01-26 ENCOUNTER — Ambulatory Visit
Admission: RE | Admit: 2012-01-26 | Discharge: 2012-01-26 | Disposition: A | Discharge status: Home / Self Care | Payer: BC Managed Care – PPO | Source: Ambulatory Visit | Attending: Radiation Oncology | Admitting: Radiation Oncology

## 2012-01-27 ENCOUNTER — Ambulatory Visit
Admission: RE | Admit: 2012-01-27 | Discharge: 2012-01-27 | Disposition: A | Discharge status: Home / Self Care | Payer: BC Managed Care – PPO | Source: Ambulatory Visit | Attending: Radiation Oncology | Admitting: Radiation Oncology

## 2012-01-30 ENCOUNTER — Ambulatory Visit
Admission: RE | Admit: 2012-01-30 | Discharge: 2012-01-30 | Disposition: A | Discharge status: Home / Self Care | Payer: BC Managed Care – PPO | Source: Ambulatory Visit | Attending: Radiation Oncology | Admitting: Radiation Oncology

## 2012-01-31 ENCOUNTER — Ambulatory Visit
Admission: RE | Admit: 2012-01-31 | Discharge: 2012-01-31 | Disposition: A | Discharge status: Home / Self Care | Payer: BC Managed Care – PPO | Source: Ambulatory Visit | Attending: Radiation Oncology | Admitting: Radiation Oncology

## 2012-01-31 ENCOUNTER — Ambulatory Visit (INDEPENDENT_AMBULATORY_CARE_PROVIDER_SITE_OTHER): Payer: BC Managed Care – PPO | Admitting: General Surgery

## 2012-01-31 ENCOUNTER — Encounter (INDEPENDENT_AMBULATORY_CARE_PROVIDER_SITE_OTHER): Payer: Self-pay | Admitting: General Surgery

## 2012-01-31 VITALS — BP 128/78 | HR 84 | Temp 97.4°F | Resp 24 | Ht 64.0 in | Wt 241.2 lb

## 2012-01-31 DIAGNOSIS — C50319 Malignant neoplasm of lower-inner quadrant of unspecified female breast: Secondary | ICD-10-CM | POA: Insufficient documentation

## 2012-01-31 DIAGNOSIS — C50919 Malignant neoplasm of unspecified site of unspecified female breast: Secondary | ICD-10-CM

## 2012-01-31 DIAGNOSIS — C50912 Malignant neoplasm of unspecified site of left female breast: Secondary | ICD-10-CM

## 2012-01-31 NOTE — Progress Notes (Signed)
Routine weekly radiation under treat assessment by MD. Has completed 15 of 35 treatments of left  Breast. Moderate hyperpigmentation with tenderness of left axilla. No questions or concerns. Seen by surgeon Dr.Toth today .

## 2012-01-31 NOTE — Progress Notes (Signed)
Weekly Management Note Current Dose: 27  Gy  Projected Dose: 50.4 Gy   Narrative:  The patient presents for routine under treatment assessment.  CBCT/MVCT images/Port film x-rays were reviewed.  The chart was checked. Doing well. Som irritation in axilla. Otherwise feeling well. Using radiaplex.  Physical Findings: Weight: 242 lb 9.6 oz (110.043 kg). Some skin redness under axilla.  Breast is slightly pink.   Impression:  The patient is tolerating radiation.  Plan:  Continue treatment as planned. Continue radiaplex.

## 2012-02-01 ENCOUNTER — Ambulatory Visit
Admission: RE | Admit: 2012-02-01 | Discharge: 2012-02-01 | Disposition: A | Discharge status: Home / Self Care | Payer: BC Managed Care – PPO | Source: Ambulatory Visit | Attending: Radiation Oncology | Admitting: Radiation Oncology

## 2012-02-02 ENCOUNTER — Ambulatory Visit
Admission: RE | Admit: 2012-02-02 | Discharge: 2012-02-02 | Disposition: A | Discharge status: Home / Self Care | Payer: BC Managed Care – PPO | Source: Ambulatory Visit | Attending: Radiation Oncology | Admitting: Radiation Oncology

## 2012-02-03 ENCOUNTER — Ambulatory Visit
Admission: RE | Admit: 2012-02-03 | Discharge: 2012-02-03 | Disposition: A | Discharge status: Home / Self Care | Payer: BC Managed Care – PPO | Source: Ambulatory Visit | Attending: Radiation Oncology | Admitting: Radiation Oncology

## 2012-02-03 DIAGNOSIS — C50319 Malignant neoplasm of lower-inner quadrant of unspecified female breast: Secondary | ICD-10-CM

## 2012-02-03 MED ORDER — RADIAPLEXRX EX GEL
Freq: Once | CUTANEOUS | Status: AC
  Administered 2012-02-03: 1 via TOPICAL

## 2012-02-06 ENCOUNTER — Ambulatory Visit
Admission: RE | Admit: 2012-02-06 | Discharge: 2012-02-06 | Disposition: A | Discharge status: Home / Self Care | Payer: BC Managed Care – PPO | Source: Ambulatory Visit | Attending: Radiation Oncology | Admitting: Radiation Oncology

## 2012-02-07 ENCOUNTER — Ambulatory Visit
Admission: RE | Admit: 2012-02-07 | Discharge: 2012-02-07 | Disposition: A | Discharge status: Home / Self Care | Payer: BC Managed Care – PPO | Source: Ambulatory Visit | Attending: Radiation Oncology | Admitting: Radiation Oncology

## 2012-02-07 ENCOUNTER — Encounter: Payer: Self-pay | Admitting: Radiation Oncology

## 2012-02-07 VITALS — Wt 244.2 lb

## 2012-02-07 DIAGNOSIS — C50912 Malignant neoplasm of unspecified site of left female breast: Secondary | ICD-10-CM

## 2012-02-07 DIAGNOSIS — C50319 Malignant neoplasm of lower-inner quadrant of unspecified female breast: Secondary | ICD-10-CM

## 2012-02-07 MED ORDER — BIAFINE EX EMUL
CUTANEOUS | Status: DC | PRN
  Administered 2012-02-07: 15:00:00 via TOPICAL

## 2012-02-07 NOTE — Progress Notes (Signed)
[facility]    Radiation Oncology [address] [place]     Maryln Gottron, M.D. [place], [place] [address]               Billie Lade, M.D., Ph.D. Phone: [phone]      Molli Hazard A. Kathrynn Running, M.D. Fax: [phone]      Radene Gunning, M.D., Ph.D.         Lurline Hare, M.D.         Grayland Jack, M.D Weekly Treatment Management Note  Name: Heather Patton     MRN: [id number]        CSN: [id number] Date: 02/07/2012      DOB: [birth year]  CC: Julian Hy, MD, MD         [place]    Status: Outpatient  Diagnosis: The primary encounter diagnosis was Cancer of lower-inner quadrant of female breast. A diagnosis of Breast cancer, left breast was also pertinent to this visit.  Current Dose: 3600 Current Fraction: 20 Planned Dose: 6300 cGy   Narrative: Heather Patton was seen today for weekly treatment management. The chart was checked and port films  were reviewed. Patient is tolerating her treatments well except for some discomfort and itching within the breast and low axillary area.. Patient is having some mild fatigue in addition.  Review of patient's allergies indicates no known allergies.   Current Outpatient Prescriptions  Medication Sig Dispense Refill  . calcium citrate-vitamin D (CITRACAL+D) 315-200 MG-UNIT per tablet Take 1 tablet by mouth daily. Calcium citrate 630mg     Vitamin D  500iu       . docusate sodium (COLACE) 100 MG capsule Take 100 mg by mouth daily.       Marland Kitchen ezetimibe-simvastatin (VYTORIN) 10-80 MG per tablet Take 1 tablet by mouth at bedtime.        . hydrochlorothiazide (HYDRODIURIL) 25 MG tablet 25 mg as needed.       Marland Kitchen levothyroxine (SYNTHROID, LEVOTHROID) 150 MCG tablet Take 150 mcg by mouth daily.        Marland Kitchen nystatin-triamcinolone (MYCOLOG II) cream        Current Facility-Administered Medications  Medication Dose Route Frequency Provider Last Rate Last Dose  . topical emolient (BIAFINE) emulsion   Topical PRN Billie Lade, MD         Labs:  Lab Results  Component Value Date   WBC 7.7 01/02/2012   HGB 14.8 01/02/2012   HCT 42.9 01/02/2012   MCV 85.2 01/02/2012   PLT 253 01/02/2012   Lab Results  Component Value Date   CREATININE 0.69 11/02/2011   BUN 13 11/02/2011   NA 140 11/02/2011   K 3.7 11/02/2011   CL 104 11/02/2011   CO2 26 11/02/2011   Lab Results  Component Value Date   ALT 23 11/02/2011   AST 20 11/02/2011   BILITOT 0.4 11/02/2011    Physical Examination:  weight is 244 lb 3.2 oz (110.768 kg).    Wt Readings from Last 3 Encounters:  02/07/12 244 lb 3.2 oz (110.768 kg)  01/31/12 242 lb 9.6 oz (110.043 kg)  01/31/12 241 lb 3.2 oz (109.408 kg)     Lungs - Normal respiratory effort, chest expands symmetrically. Lungs are clear to auscultation, no crackles or wheezes.  Heart has regular rhythm and rate  Abdomen is soft and non tender with normal bowel sounds Examination of the left breast shows some erythema and radiation dermatitis in the upper inner aspect  of the breast. There is erythema in the low axillary area but no moist desquamation.   Assessment:  Patient tolerating treatments well  Plan: Continue treatment per original radiation prescription.  given the patient's itching as well as discomfort she will be switched to Biafine for her skin reaction.

## 2012-02-07 NOTE — Treatment Plan (Signed)
   Department of Radiation Oncology  Phone:  [phone] Fax:        [phone] Title simulation note  earllier today Heather Patton underwent additional planning for radiation therapy directed at the left breast area. Patient's treatment planning CT scan was reviewed.  She had set up of a photon boost field directed at the site of presentation within the upper inner quadrant of the left breast. Patient had set up of an AP and left posterior oblique field. A combination of 10 and 18 MV photons will be used to deliver the patient's treatments.  Plan patient will receive 7 additional treatments at 180 cGy per day for an additional dose of 1260 cGy and cumulative dose of 6300 centigrade. A computerized isodose plan is requested for treatment.

## 2012-02-07 NOTE — Progress Notes (Signed)
Pt alert and oriented, top of left breast chest area dermatitis,, starting to itch more, using raiaplex gel, and erythema under left axilla,peeling , nipple stings occasionally,20/35 txs2:23 PM

## 2012-02-08 ENCOUNTER — Ambulatory Visit
Admission: RE | Admit: 2012-02-08 | Discharge: 2012-02-08 | Disposition: A | Discharge status: Home / Self Care | Payer: BC Managed Care – PPO | Source: Ambulatory Visit | Attending: Radiation Oncology | Admitting: Radiation Oncology

## 2012-02-09 ENCOUNTER — Ambulatory Visit
Admission: RE | Admit: 2012-02-09 | Discharge: 2012-02-09 | Disposition: A | Discharge status: Home / Self Care | Payer: BC Managed Care – PPO | Source: Ambulatory Visit | Attending: Radiation Oncology | Admitting: Radiation Oncology

## 2012-02-10 ENCOUNTER — Ambulatory Visit
Admission: RE | Admit: 2012-02-10 | Discharge: 2012-02-10 | Disposition: A | Discharge status: Home / Self Care | Payer: BC Managed Care – PPO | Source: Ambulatory Visit | Attending: Radiation Oncology | Admitting: Radiation Oncology

## 2012-02-13 ENCOUNTER — Ambulatory Visit
Admission: RE | Admit: 2012-02-13 | Discharge: 2012-02-13 | Disposition: A | Discharge status: Home / Self Care | Payer: BC Managed Care – PPO | Source: Ambulatory Visit | Attending: Radiation Oncology | Admitting: Radiation Oncology

## 2012-02-14 ENCOUNTER — Ambulatory Visit
Admission: RE | Admit: 2012-02-14 | Discharge: 2012-02-14 | Disposition: A | Discharge status: Home / Self Care | Payer: BC Managed Care – PPO | Source: Ambulatory Visit | Attending: Radiation Oncology | Admitting: Radiation Oncology

## 2012-02-14 ENCOUNTER — Ambulatory Visit
Admission: RE | Admit: 2012-02-14 | Discharge: 2012-02-14 | Payer: BC Managed Care – PPO | Source: Ambulatory Visit | Attending: Radiation Oncology | Admitting: Radiation Oncology

## 2012-02-14 ENCOUNTER — Encounter: Payer: Self-pay | Admitting: Radiation Oncology

## 2012-02-14 DIAGNOSIS — C50912 Malignant neoplasm of unspecified site of left female breast: Secondary | ICD-10-CM

## 2012-02-14 NOTE — Progress Notes (Signed)
DIAGNOSIS:  Left breast cancer.  NARRATIVE:  Heather Patton is seen today for weekly assessment.  She has completed 4500 cGy of a planned 6300 cGy directed at the left breast area.  The patient is starting to have some sensitivity, particularly in the axillary area.  The patient denies any significant fatigue at this time.  She is using Biafine for her skin.  EXAMINATION:  There is brisk reaction in the upper outer aspect of the left breast without any obvious moist desquamation.  There is also some radiation dermatitis in the upper-inner aspect of the breast and some erythema/hyperpigmentation changes in the inframammary fold area.  IMPRESSION AND PLAN:  The patient is tolerating her treatments reasonably well except for issues as above.  The patient's radiation fields are setting up accurately.  The patient's radiation chart was checked today.  Plan is to continue to a cumulative dose of 6300 cGy as breast-conserving therapy.  In light of the patient's discomfort on axillary area, the patient will use the Hydrogel dressing for this issue.    ______________________________ Billie Lade, Ph.D., M.D. JDK/MEDQ  D:  02/14/2012  T:  02/14/2012  Job:  [id number]

## 2012-02-14 NOTE — Progress Notes (Signed)
Here for weekly under treat visit with md for radiation to left breast.Has completed 25 of 28 whole breast/axilla treatments. Hyperpigmentation of axilla and follicular reaction of mid chest. To continue application of biafine 2 to 3 times/day. Mild fatigue and discomfort.

## 2012-02-15 ENCOUNTER — Ambulatory Visit
Admission: RE | Admit: 2012-02-15 | Discharge: 2012-02-15 | Disposition: A | Discharge status: Home / Self Care | Payer: BC Managed Care – PPO | Source: Ambulatory Visit | Attending: Radiation Oncology | Admitting: Radiation Oncology

## 2012-02-16 ENCOUNTER — Ambulatory Visit
Admission: RE | Admit: 2012-02-16 | Discharge: 2012-02-16 | Disposition: A | Discharge status: Home / Self Care | Payer: BC Managed Care – PPO | Source: Ambulatory Visit | Attending: Radiation Oncology | Admitting: Radiation Oncology

## 2012-02-17 ENCOUNTER — Ambulatory Visit
Admission: RE | Admit: 2012-02-17 | Discharge: 2012-02-17 | Disposition: A | Discharge status: Home / Self Care | Payer: BC Managed Care – PPO | Source: Ambulatory Visit | Attending: Radiation Oncology | Admitting: Radiation Oncology

## 2012-02-19 ENCOUNTER — Encounter (INDEPENDENT_AMBULATORY_CARE_PROVIDER_SITE_OTHER): Payer: Self-pay | Admitting: General Surgery

## 2012-02-19 NOTE — Progress Notes (Signed)
Subjective:     Patient ID: Heather Patton, female   DOB: [birth year], 66 y.o.   MRN: [id number]  HPI The patient is a 66 year old female who is about 6 weeks out from a left breast lumpectomy and negative sentinel node biopsy for a left breast cancer. Overall she seems to be doing well and has no complaints today other than some occasional sharp pains that come and go.  Review of Systems     Objective:   Physical Exam On exam her left breast and axillary incisions are healing nicely with no sign of infection.    Assessment:     Approximately 6 weeks out from a left breast lumpectomy and negative sentinel node biopsy    Plan:     At this point she may return to all her normal activities. She will continue to follow with the radiation oncologist and medical oncologist. We will plan to see her back in about 3 months.

## 2012-02-20 ENCOUNTER — Ambulatory Visit
Admission: RE | Admit: 2012-02-20 | Discharge: 2012-02-20 | Disposition: A | Discharge status: Home / Self Care | Payer: BC Managed Care – PPO | Source: Ambulatory Visit | Attending: Radiation Oncology | Admitting: Radiation Oncology

## 2012-02-20 NOTE — Procedures (Signed)
DIAGNOSIS:  Left breast cancer.  NARRATIVE:  On February 17, 2012 Heather Patton underwent film verification of her setup directed at the left breast area.  The patient's isocenter was accurately placed and the multileaf collimators contoured the target volume accurately on both fields imaged.    ______________________________ Billie Lade, Ph.D., M.D. JDK/MEDQ  D:  02/20/2012  T:  02/20/2012  Job:  [id number]

## 2012-02-21 ENCOUNTER — Ambulatory Visit
Admission: RE | Admit: 2012-02-21 | Discharge: 2012-02-21 | Disposition: A | Discharge status: Home / Self Care | Payer: BC Managed Care – PPO | Source: Ambulatory Visit | Attending: Radiation Oncology | Admitting: Radiation Oncology

## 2012-02-21 ENCOUNTER — Encounter: Payer: Self-pay | Admitting: Radiation Oncology

## 2012-02-21 VITALS — BP 121/79 | HR 74 | Resp 18 | Wt 241.0 lb

## 2012-02-21 DIAGNOSIS — C50319 Malignant neoplasm of lower-inner quadrant of unspecified female breast: Secondary | ICD-10-CM

## 2012-02-21 MED ORDER — BIAFINE EX EMUL
Freq: Every day | CUTANEOUS | Status: DC
  Administered 2012-02-21: 15:00:00 via TOPICAL

## 2012-02-21 NOTE — Progress Notes (Signed)
DIAGNOSIS:  Left breast cancer.  Heather Patton was seen today for weekly assessment.  She has completed 5400 cGy of a planned 6300 cGy directed at the left breast area.  The patient continues to tolerate her treatments quite well.  She has noticed mild sensitivity in the breast but no significant pruritus.  She denies any significant fatigue.  EXAMINATION:  Lungs:  Clear.  Heart:  Regular rhythm and rate.  Breasts: The left breast area shows hyperpigmentation changes and erythema particularly in the upper-inner aspect of the breast and inframammary fold areas.  These areas are currently not receiving radiation treatment.  There is no moist desquamation noted.  IMPRESSION AND PLAN:  The patient is tolerating her treatments well at this time.  The patient's radiation fields are setting up accurately. The patient's radiation chart was checked today.  Plan is to continue with 5 additional treatments for a cumulative dose of 6300 cGy.    ______________________________ Heather Patton, Ph.D., M.D. JDK/MEDQ  D:  02/21/2012  T:  02/21/2012  Job:  [id number]

## 2012-02-21 NOTE — Progress Notes (Signed)
Patient presents to the clinic today unaccompanied for under treat visit with Dr. Roselind Messier. Patient is alert and oriented to person, place, and time. No distress noted. Steady gait noted. Pleasant affect noted. Patient denies pain at this time. Patient reports that each morning her left arm is "achy." Patient reports hyperpigmentation of the left/treated breast without breaks in the skin. Patient reports using Biafine as directed. Provided patient with another tube of Biafine today. No edema note in left arm. Reported all findings to Dr. Roselind Messier.

## 2012-02-22 ENCOUNTER — Ambulatory Visit
Admission: RE | Admit: 2012-02-22 | Discharge: 2012-02-22 | Disposition: A | Discharge status: Home / Self Care | Payer: BC Managed Care – PPO | Source: Ambulatory Visit | Attending: Radiation Oncology | Admitting: Radiation Oncology

## 2012-02-23 ENCOUNTER — Ambulatory Visit
Admission: RE | Admit: 2012-02-23 | Discharge: 2012-02-23 | Disposition: A | Discharge status: Home / Self Care | Payer: BC Managed Care – PPO | Source: Ambulatory Visit | Attending: Radiation Oncology | Admitting: Radiation Oncology

## 2012-02-24 ENCOUNTER — Ambulatory Visit
Admission: RE | Admit: 2012-02-24 | Discharge: 2012-02-24 | Disposition: A | Discharge status: Home / Self Care | Payer: BC Managed Care – PPO | Source: Ambulatory Visit | Attending: Radiation Oncology | Admitting: Radiation Oncology

## 2012-02-27 ENCOUNTER — Ambulatory Visit
Admission: RE | Admit: 2012-02-27 | Discharge: 2012-02-27 | Disposition: A | Discharge status: Home / Self Care | Payer: BC Managed Care – PPO | Source: Ambulatory Visit | Attending: Radiation Oncology | Admitting: Radiation Oncology

## 2012-02-28 ENCOUNTER — Encounter: Payer: Self-pay | Admitting: Radiation Oncology

## 2012-02-28 ENCOUNTER — Ambulatory Visit
Admission: RE | Admit: 2012-02-28 | Discharge: 2012-02-28 | Disposition: A | Discharge status: Home / Self Care | Payer: BC Managed Care – PPO | Source: Ambulatory Visit | Attending: Radiation Oncology | Admitting: Radiation Oncology

## 2012-02-28 VITALS — Wt 242.0 lb

## 2012-02-28 DIAGNOSIS — C50912 Malignant neoplasm of unspecified site of left female breast: Secondary | ICD-10-CM

## 2012-02-28 NOTE — Progress Notes (Signed)
Pt alert and oriented x3, left breast raw looking under arm and fold of breast, using biafine cream, completed treatments 35/35, no c/o pain, just irritation and soreness, encouraged FYYN 8 week clinic, patient went to the ABC class yesterday,was helpful 2:13 PM

## 2012-02-29 NOTE — Progress Notes (Signed)
DIAGNOSIS:  Left breast cancer.  NARRATIVE:  Heather Patton is seen today for weekly assessment.  She actually completed her last radiation therapy earlier today, totaling 6300 cGy.  The patient does have some irritation and soreness in the treatment area, but overall seems to have tolerated her treatments well. The patient did undergo the ABC class yesterday which seemed to be very helpful for the patient.  The patient continues to use Biafine for her skin.  PHYSICAL EXAMINATION:  The  breast shows diffuse erythema and hyperpigmentation changes.  There is no moist desquamation noted.  IMPRESSION AND PLAN:  The patient has completed her therapy.  Her radiation fields have set up accurately.  The patient's radiation chart was checked today.  The patient will be scheduled for followup in 1 month.    ______________________________ Billie Lade, Ph.D., M.D. JDK/MEDQ  D:  02/28/2012  T:  02/28/2012  Job:  [id number]

## 2012-02-29 NOTE — Progress Notes (Signed)
CC:   Heather Patton. [name], M.D. [name], M.D. [name]. [name], M.D. [name], M.D.  DIAGNOSIS:  Stage I left breast cancer.  INDICATION FOR THERAPY:  Breast conservation.  TREATMENT DATES:  January 11, 2012 through February 28, 2012.  SITE/DOSE:  Left breast 5040 cGy in 28 fractions (180 cGy per fraction). The site of presentation in the upper inner aspect of the breast area was boosted further to a cumulative dose of 6300 cGy.  ENERGY/FIELD:  The patient was initially treated with tangential beams encompassing the left breast.  Forward planning was used to improve the dose homogeneity.  The patient was treated with 6 mV as well as 18 mV photons.  After 28 treatments, the patient underwent therapy directed at the site of presentation in the upper inner aspect of the left breast. The patient was treated with mixed energies using AP 10 mV beam and an 18 mV left posterior oblique beam.  The patient received 7 additional treatments for a cumulative dose to the site of presentation of 6300 cGy.  NARRATIVE:  [name] overall tolerated her treatments quite well. She had minimal discomfort and pruritus as well as fatigue during the course of her treatment.  She did not experience any moist desquamation during the course of her therapy.  FOLLOWUP APPOINTMENT:  One month.    ______________________________ Billie Lade, Ph.D., M.D. JDK/MEDQ  D:  02/28/2012  T:  02/28/2012  Job:  [id number]

## 2012-03-19 ENCOUNTER — Ambulatory Visit
Admission: RE | Admit: 2012-03-19 | Discharge: 2012-03-19 | Disposition: A | Discharge status: Home / Self Care | Payer: BC Managed Care – PPO | Source: Ambulatory Visit | Attending: Radiation Oncology | Admitting: Radiation Oncology

## 2012-03-19 ENCOUNTER — Encounter: Payer: Self-pay | Admitting: Radiation Oncology

## 2012-03-19 DIAGNOSIS — C50319 Malignant neoplasm of lower-inner quadrant of unspecified female breast: Secondary | ICD-10-CM

## 2012-03-19 NOTE — Progress Notes (Signed)
  Radiation Oncology         [phone] ________________________________  Name: Heather Patton MRN: [id number]  Date: 03/19/2012  DOB: 08/04/1946  Follow-Up Visit Note  CC: Julian Hy, MD, MD  Robyne Askew, MD  Diagnosis:   Stage I Left Breast Cancer  Interval Since Last Radiation:  3 weeks  Narrative:  The patient returns today for routine follow-up.  She is doing well with minimal fatigue. The patient has mild pruritis and discomfort within the left breast area. The patient did start letrozole and seems to be tolerating this medication well. The patient does complain of some soreness in the left mid arm area. She has recently started some arm exercises as recommended by the after breast cancer support group.                             ALLERGIES:   has no known allergies.  Meds: Current Outpatient Prescriptions  Medication Sig Dispense Refill  . calcium citrate-vitamin D (CITRACAL+D) 315-200 MG-UNIT per tablet Take 1 tablet by mouth daily. Calcium citrate 630mg     Vitamin D  500iu       . docusate sodium (COLACE) 100 MG capsule Take 100 mg by mouth daily.       Marland Kitchen emollient (BIAFINE) cream Apply topically as needed.      . ezetimibe-simvastatin (VYTORIN) 10-80 MG per tablet Take 1 tablet by mouth at bedtime.        . hydrochlorothiazide (HYDRODIURIL) 25 MG tablet 25 mg as needed.       Marland Kitchen letrozole (FEMARA) 2.5 MG tablet Take 2.5 mg by mouth daily.      Marland Kitchen levothyroxine (SYNTHROID, LEVOTHROID) 150 MCG tablet Take 150 mcg by mouth daily.        Marland Kitchen nystatin-triamcinolone (MYCOLOG II) cream         Physical Findings: The patient is in no acute distress. Patient is alert and oriented.  vitals were not taken for this visit..  The left breast area shows some hyperpigmentation changes and mild edema. The patient's skin is well healed at this time. There is no dominant mass appreciated in the left breast. The left arm shows no palpable cords. There may be some mild swelling in  the left arm compared to the right side.  Lab Findings: Lab Results  Component Value Date   WBC 7.7 01/02/2012   HGB 14.8 01/02/2012   HCT 42.9 01/02/2012   MCV 85.2 01/02/2012   PLT 253 01/02/2012    @LASTCHEM @  Radiographic Findings: No results found.  Impression:  The patient is recovering from the effects of radiation.  Recommended patient continue  her arm exercises. If her left arm shows more swelling or causes  more discomfort then she will contact the [place] or her surgeon for further evaluation.  Plan:  Five-month followup  _____________________________________  Billie Lade, M.D.

## 2012-03-19 NOTE — Progress Notes (Signed)
Here for routine one month follow up post completion of radiation of left breast on February 28, 2012. Skin almost completely healed. Just mild discoloration and itching of mid chest.Informed to continue application of moisturizing lotion or hydrocortisone 1%. Denies breast pain but has some arm soreness in left upper arm. Continues to perform arm exercise bid.

## 2012-03-30 ENCOUNTER — Telehealth (INDEPENDENT_AMBULATORY_CARE_PROVIDER_SITE_OTHER): Payer: Self-pay | Admitting: General Surgery

## 2012-03-30 NOTE — Telephone Encounter (Signed)
Left VM letting pt know that Dr. Carolynne Edouard had to cancel his office on 6/4 and that I am rescheduling her apt to Monday 6/3 at 3:40 in the afternoon.  I told her if she has any questions or concerns to feel free to call me back.

## 2012-04-18 ENCOUNTER — Encounter (INDEPENDENT_AMBULATORY_CARE_PROVIDER_SITE_OTHER): Payer: Self-pay | Admitting: General Surgery

## 2012-04-20 NOTE — Progress Notes (Signed)
Encounter addended by: Amanda Pea, RN on: 04/20/2012  5:44 PM<BR>     Documentation filed: Charges VN

## 2012-04-23 ENCOUNTER — Ambulatory Visit (INDEPENDENT_AMBULATORY_CARE_PROVIDER_SITE_OTHER): Payer: BC Managed Care – PPO | Admitting: General Surgery

## 2012-04-23 ENCOUNTER — Encounter (INDEPENDENT_AMBULATORY_CARE_PROVIDER_SITE_OTHER): Payer: Self-pay | Admitting: General Surgery

## 2012-04-23 VITALS — BP 117/63 | HR 78 | Temp 98.6°F | Resp 16 | Ht 64.0 in | Wt 239.2 lb

## 2012-04-23 DIAGNOSIS — C50919 Malignant neoplasm of unspecified site of unspecified female breast: Secondary | ICD-10-CM

## 2012-04-23 DIAGNOSIS — C50912 Malignant neoplasm of unspecified site of left female breast: Secondary | ICD-10-CM

## 2012-04-23 NOTE — Patient Instructions (Signed)
Continue regular self exams Continue letrazole

## 2012-04-23 NOTE — Progress Notes (Signed)
Subjective:     Patient ID: Heather Patton, female   DOB: 02/28/1946, 66 y.o.   MRN: [id number]  HPI The patient is a 66 a white female who is about 4 months out from a left breast lumpectomy and negative sentinel node biopsy for a T1bN0 left breast cancer. She was ER and PR positive and HER-2/neu negative. She finished radiation therapy in early April. She is now taking Letrozole and tolerating it well. She will not receive any chemotherapy. She has done well since her last appointment and has no complaints today.  Review of Systems  Constitutional: Negative.   HENT: Negative.   Eyes: Negative.   Respiratory: Negative.   Cardiovascular: Negative.   Gastrointestinal: Negative.   Genitourinary: Negative.   Musculoskeletal: Negative.   Skin: Negative.   Neurological: Negative.   Hematological: Negative.   Psychiatric/Behavioral: Negative.        Objective:   Physical Exam  Constitutional: She is oriented to person, place, and time. She appears well-developed and well-nourished.  HENT:  Head: Normocephalic and atraumatic.  Eyes: Conjunctivae and EOM are normal. Pupils are equal, round, and reactive to light.  Neck: Normal range of motion. Neck supple.  Cardiovascular: Normal rate, regular rhythm and normal heart sounds.   Pulmonary/Chest: Effort normal and breath sounds normal.       The patient has some thickness to the scar on the medial portion of the left breast. No other palpable mass in either breast. No palpable axillary supraclavicular or cervical lymphadenopathy.  Abdominal: Soft. Bowel sounds are normal. She exhibits no mass. There is no tenderness.  Musculoskeletal: Normal range of motion.  Lymphadenopathy:    She has no cervical adenopathy.  Neurological: She is alert and oriented to person, place, and time.  Skin: Skin is warm and dry.  Psychiatric: She has a normal mood and affect. Her behavior is normal.       Assessment:     4 months status post left breast  lumpectomy and negative sentinel node biopsy    Plan:     At this point she seems to be doing very well. She will continue to take Letrozole. She will continue to do regular self exams. We will plan to see her back in about 3 months.

## 2012-04-24 ENCOUNTER — Ambulatory Visit (INDEPENDENT_AMBULATORY_CARE_PROVIDER_SITE_OTHER): Payer: BC Managed Care – PPO | Admitting: General Surgery

## 2012-05-01 ENCOUNTER — Ambulatory Visit (HOSPITAL_BASED_OUTPATIENT_CLINIC_OR_DEPARTMENT_OTHER): Payer: BC Managed Care – PPO | Admitting: Oncology

## 2012-05-01 ENCOUNTER — Other Ambulatory Visit (HOSPITAL_BASED_OUTPATIENT_CLINIC_OR_DEPARTMENT_OTHER): Payer: BC Managed Care – PPO | Admitting: Lab

## 2012-05-01 ENCOUNTER — Telehealth: Payer: Self-pay | Admitting: Oncology

## 2012-05-01 VITALS — BP 125/76 | HR 78 | Temp 98.5°F | Ht 64.0 in | Wt 240.4 lb

## 2012-05-01 DIAGNOSIS — Z170 Estrogen receptor positive status [ER+]: Secondary | ICD-10-CM

## 2012-05-01 DIAGNOSIS — C50319 Malignant neoplasm of lower-inner quadrant of unspecified female breast: Secondary | ICD-10-CM

## 2012-05-01 DIAGNOSIS — C50919 Malignant neoplasm of unspecified site of unspecified female breast: Secondary | ICD-10-CM

## 2012-05-01 LAB — CBC WITH DIFFERENTIAL/PLATELET
BASO%: 0.4 % (ref 0.0–2.0)
EOS%: 2.2 % (ref 0.0–7.0)
HCT: 39.8 % (ref 34.8–46.6)
LYMPH%: 19.4 % (ref 14.0–49.7)
MCH: 28.8 pg (ref 25.1–34.0)
MCHC: 33.6 g/dL (ref 31.5–36.0)
NEUT%: 70.2 % (ref 38.4–76.8)
Platelets: 200 10*3/uL (ref 145–400)

## 2012-05-01 MED ORDER — LETROZOLE 2.5 MG PO TABS: 2.5000 mg | ORAL_TABLET | Freq: Every day | ORAL | Status: DC

## 2012-05-01 NOTE — Telephone Encounter (Signed)
gve the pt her jan 2013 appt calendar °

## 2012-05-01 NOTE — Progress Notes (Signed)
ID: Heather Patton   DOB: 01/25/1946  MR#: [id number]  ION#:[id number]  HISTORY OF PRESENT ILLNESS: The patient had routine screening mammography at the breast center 09/21/2011. A possible mass was noted in the left breast, and she was recalled for additional views November 21, 29012. Diagnostic mammography showed a low density spiculated mass in the lower outer quadrant of the left breast measuring 6 mm. This was not palpable to the mammographer. Ultrasound showed only normal tissue in the area in question.  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  With this information the patient underwent bilateral breast MRI 10/28/2011. A post biopsy hematoma was noted in the lower inner aspect of the left breast, with a small area of masslike enhancement associated with it. This area measured 7 mm. The MRI showed a second irregularly marginated mass in the central portion of the left breast 2.6 cm away from the biopsied mass. The second mass measured 5 mm maximally. There was no abnormality in the right breast and no adenopathy noted.  The patient was evaluated at the multidisciplinary breast clinic on 11/02/2011 and underwent definitive surgery 12/05/2011. Her subsequent history is as detailed below.  INTERVAL HISTORY: Mother returns today for followup of her breast cancer her husband Elijah Birk was busy and did not accompany her today, which in her case is a sign that both of them feel things are going well. In fact she completed her radiation treatments in April, and started the letrozole at that time.   REVIEW OF SYSTEMS: So far she has had absolutely no side effects from the letrozole. In particular she has had no hot flashes and no problems with vaginal dryness. She was able to get the medication for $12 a month  which is a good Price. She also tolerated the radiation well, with mild erythema but no significant desquamation and no significant fatigue. The only symptoms she is reporting is bilateral ankle swelling. This is not a new problem and it is not worse than before. A detailed review of systems was otherwise entirely negative  PAST MEDICAL HISTORY: Past Medical History  Diagnosis Date  . Degenerative joint disease   . GERD (gastroesophageal reflux disease)   . Osteopenia   . Sleep apnea   . Hx of colonic polyps   . Hypothyroidism   . Hyperlipidemia     PAST SURGICAL HISTORY: Past Surgical History  Procedure Date  . Tonsillectomy   . Abdominal hysterectomy   . Cervical disc surgery   . Bunionectomy 1985    lt  . Colonoscopy   . Breast biopsy 12/05/2011    left breast    FAMILY HISTORY Family History  Problem Relation Age of Onset  . Cancer Mother     breast  . Cancer Cousin     breast  . Heart disease Father   the patient's father died at the age of 70 from a myocardial infarction. The patient's mother was diagnosed with breast cancer at age 50, but she died at age 36 following a fall. The patient had no brothers, has one sister. The only other breast cancer diagnosis in the family was a cousin on the maternal side. The patient does not know at what age she was diagnosed. There is no history of ovarian cancer in the family.   GYNECOLOGIC HISTORY: GX P0. Menarche  age 44, menopause 18 she took hormone replacement for about 10 years, discontinuing that in 2006.   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 The patient is a close friend of one of my patients, Teondra Newburg    ADVANCED DIRECTIVES: in place  HEALTH MAINTENANCE: History  Substance Use Topics  . Smoking status: Former Smoker -- 0.5 packs/day    Quit date: 11/21/1978  . Smokeless tobacco: Never Used  . Alcohol Use: 3.6 oz/week    6 Glasses of wine  per week     Colonoscopy: 2010/Magod  PAP:UTD/Lomax  Bone density: Sept 2012/ Lomax  Lipid panel: [facility]  No Known Allergies  Current Outpatient Prescriptions  Medication Sig Dispense Refill  . calcium citrate-vitamin D (CITRACAL+D) 315-200 MG-UNIT per tablet Take 1 tablet by mouth daily. Calcium citrate 630mg     Vitamin D  500iu       . docusate sodium (COLACE) 100 MG capsule Take 100 mg by mouth daily.       Marland Kitchen ezetimibe-simvastatin (VYTORIN) 10-80 MG per tablet Take 1 tablet by mouth at bedtime.        . hydrochlorothiazide (HYDRODIURIL) 25 MG tablet 25 mg as needed.       Marland Kitchen letrozole (FEMARA) 2.5 MG tablet Take 2.5 mg by mouth daily.      Marland Kitchen levothyroxine (SYNTHROID, LEVOTHROID) 150 MCG tablet Take 150 mcg by mouth daily.        Marland Kitchen emollient (BIAFINE) cream Apply topically as needed.        OBJECTIVE: Middle-aged white woman who appears well Filed Vitals:   05/01/12 1506  BP: 125/76  Pulse: 78  Temp: 98.5 F (36.9 C)     Body mass index is 41.26 kg/(m^2).    ECOG FS: 0  Sclerae unicteric Oropharynx clear No cervical or supraclavicular adenopathy Lungs no rales or rhonchi Heart regular rate and rhythm Abd benign MSK no focal spinal tenderness, no peripheral edema Neuro: nonfocal Breasts: The right breast is unremarkable. The left breast is status post lumpectomy and radiation. There is almost no hyperpigmentation. The cosmetic result is excellent. He is are no skin or nipple changes of concern, no palpable masses, and no evidence of recurrence.  LAB RESULTS: Lab Results  Component Value Date   WBC 6.7 05/01/2012   NEUTROABS 4.7 05/01/2012   HGB 13.4 05/01/2012   HCT 39.8 05/01/2012   MCV 85.6 05/01/2012   PLT 200 05/01/2012      Chemistry      Component Value Date/Time   NA 140 11/02/2011 1219   K 3.7 11/02/2011 1219   CL 104 11/02/2011 1219   CO2 26 11/02/2011 1219   BUN 13 11/02/2011 1219   CREATININE 0.69 11/02/2011 1219      Component Value Date/Time    CALCIUM 9.9 11/02/2011 1219   ALKPHOS 92 11/02/2011 1219   AST 20 11/02/2011 1219   ALT 23 11/02/2011 1219   BILITOT 0.4 11/02/2011 1219       Lab Results  Component Value Date   LABCA2 27 11/02/2011    No components found with this basename: LABCA125    No results found for this basename: INR:1;PROTIME:1 in the last 168 hours  Urinalysis No results found for this basename: colorurine, appearanceur, labspec, phurine, glucoseu, hgbur, bilirubinur, ketonesur, proteinur, urobilinogen, nitrite, leukocytesur    STUDIES: No new results found. Next mammogram is due in October of this year  ASSESSMENT: 66 y.o. 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 She completed radiation April 2013, and started letrozole at that time.  PLAN: She is doing remarkably well so far with the letrozole. She really has appointments to see doctors Lomax and Carolynne Edouard in September, and Dr. [name] in August. She is going to see me again in January. The plan will be to see her every 6 months for the next year and then yearly until she completes her 5 years of letrozole. Next bone density will be due in the Fall of 2014.She knows to call for any problems that may develop before the next visit   Heather Patton C    05/01/2012

## 2012-07-30 ENCOUNTER — Encounter: Payer: Self-pay | Admitting: Radiation Oncology

## 2012-07-30 ENCOUNTER — Ambulatory Visit
Admission: RE | Admit: 2012-07-30 | Discharge: 2012-07-30 | Disposition: A | Discharge status: Home / Self Care | Payer: BC Managed Care – PPO | Source: Ambulatory Visit | Attending: Radiation Oncology | Admitting: Radiation Oncology

## 2012-07-30 VITALS — BP 146/74 | HR 73 | Temp 97.8°F | Wt 244.3 lb

## 2012-07-30 DIAGNOSIS — C50319 Malignant neoplasm of lower-inner quadrant of unspecified female breast: Secondary | ICD-10-CM

## 2012-07-30 HISTORY — DX: Reserved for concepts with insufficient information to code with codable children: IMO0002

## 2012-07-30 HISTORY — DX: Reserved for inherently not codable concepts without codable children: IMO0001

## 2012-07-30 NOTE — Progress Notes (Signed)
  Radiation Oncology         [phone]) [phone] ________________________________  Name: Heather Patton MRN: [id number]  Date: 07/30/2012  DOB: 09-25-1946  Follow-Up Visit Note  CC: Julian Hy, MD  Robyne Askew, MD  Diagnosis:   Stage I left breast cancer  Interval Since Last Radiation:  5 months  Narrative:  The patient returns today for routine follow-up.  She continues to have some soreness in the left breast but no actual pain. She is not requiring any pain medication for this issue. She denies any nipple discharge or bleeding. She is scheduled to see her surgeon tomorrow.  She is tolerating letrozole well.                                ALLERGIES:   has no known allergies.  Meds: Current Outpatient Prescriptions  Medication Sig Dispense Refill  . calcium citrate-vitamin D (CITRACAL+D) 315-200 MG-UNIT per tablet Take 1 tablet by mouth daily. Calcium citrate 630mg     Vitamin D  500iu       . docusate sodium (COLACE) 100 MG capsule Take 100 mg by mouth daily.       Marland Kitchen ezetimibe-simvastatin (VYTORIN) 10-80 MG per tablet Take 1 tablet by mouth at bedtime.        Marland Kitchen letrozole (FEMARA) 2.5 MG tablet Take 1 tablet (2.5 mg total) by mouth daily.  90 tablet  12  . levothyroxine (SYNTHROID, LEVOTHROID) 150 MCG tablet Take 150 mcg by mouth daily.        Marland Kitchen emollient (BIAFINE) cream Apply topically as needed.      . hydrochlorothiazide (HYDRODIURIL) 25 MG tablet 25 mg as needed.         Physical Findings: The patient is in no acute distress. Patient is alert and oriented.  weight is 244 lb 4.8 oz (110.814 kg). Her temperature is 97.8 F (36.6 C). Her blood pressure is 146/74 and her pulse is 73. Marland Kitchen  No palpable cervical supraclavicular or axillary adenopathy. The lungs are clear to auscultation. The heart has regular rhythm and rate.   examination right breast reveals no mass or nipple discharge. Examination left breast reveals some continued edema in the breast. There is no dominant mass  appreciated breast, nipple discharge or bleeding. patient is tender with palpation near her lumpectomy scar  Lab Findings: Lab Results  Component Value Date   WBC 6.7 05/01/2012   HGB 13.4 05/01/2012   HCT 39.8 05/01/2012   MCV 85.6 05/01/2012   PLT 200 05/01/2012    @LASTCHEM @  Radiographic Findings: No results found.  Impression:  The patient is recovering from the effects of radiation.  She continues to have some discomfort in the breast which is likely related to her edema.  no signs recurrence on clinical exam today.  Plan:  When necessary followup in radiation oncology. Patient will continue close followup with surgery and medical oncology.  _____________________________________    Billie Lade, PhD, MD

## 2012-07-31 ENCOUNTER — Ambulatory Visit (INDEPENDENT_AMBULATORY_CARE_PROVIDER_SITE_OTHER): Payer: Medicare Other | Admitting: General Surgery

## 2012-07-31 ENCOUNTER — Encounter (INDEPENDENT_AMBULATORY_CARE_PROVIDER_SITE_OTHER): Payer: Self-pay | Admitting: General Surgery

## 2012-07-31 VITALS — BP 140/84 | HR 78 | Temp 97.4°F | Resp 16 | Ht 64.0 in | Wt 243.4 lb

## 2012-07-31 DIAGNOSIS — C50912 Malignant neoplasm of unspecified site of left female breast: Secondary | ICD-10-CM

## 2012-07-31 DIAGNOSIS — C50919 Malignant neoplasm of unspecified site of unspecified female breast: Secondary | ICD-10-CM

## 2012-07-31 NOTE — Patient Instructions (Signed)
Continue regular self exams Continue letrozole 

## 2012-07-31 NOTE — Progress Notes (Signed)
Subjective:     Patient ID: Heather Patton, female   DOB: 22-Dec-1945, 66 y.o.   MRN: [id number]  HPI The patient is a 66 year old white female who is 7 months status post left breast lumpectomy and negative sentinel node biopsy for a T1 B. N0 left breast cancer. She finished her radiation therapy and is now taking letrozole. She seems to be tolerating it reasonably well. She has had some joint aches and has been started on meloxicam for that. She does note persistent soreness of the left breast. She is scheduled for her next mammogram in late October.  Review of Systems  Constitutional: Negative.   HENT: Negative.   Eyes: Negative.   Respiratory: Negative.   Cardiovascular: Negative.   Gastrointestinal: Negative.   Genitourinary: Negative.   Musculoskeletal: Negative.   Skin: Negative.   Neurological: Negative.   Hematological: Negative.   Psychiatric/Behavioral: Negative.        Objective:   Physical Exam  Constitutional: She is oriented to person, place, and time. She appears well-developed and well-nourished.  HENT:  Head: Normocephalic and atraumatic.  Eyes: Conjunctivae and EOM are normal. Pupils are equal, round, and reactive to light.  Neck: Normal range of motion. Neck supple.  Pulmonary/Chest: Effort normal and breath sounds normal.       She still has some thickness of the left breast secondary to her radiation. She has some fullness to her scar because of the radiation in the medial left breast. Otherwise no palpable mass in either breast. No palpable axillary or supraclavicular cervical lymphadenopathy.  Abdominal: Soft. Bowel sounds are normal. She exhibits no mass. There is no tenderness.  Musculoskeletal: Normal range of motion.  Lymphadenopathy:    She has no cervical adenopathy.  Neurological: She is alert and oriented to person, place, and time.  Skin: Skin is warm and dry.  Psychiatric: She has a normal mood and affect. Her behavior is normal.         Assessment:     7 months status post left breast lumpectomy    Plan:     At this point she will continue to take her Letrozole every day. She will continue to do regular self exams. We will plan to see her back in about 3 months. She will try to tolerate her mammogram that his upcoming

## 2012-10-15 ENCOUNTER — Other Ambulatory Visit (INDEPENDENT_AMBULATORY_CARE_PROVIDER_SITE_OTHER): Payer: Self-pay | Admitting: General Surgery

## 2012-10-15 DIAGNOSIS — Z9889 Other specified postprocedural states: Secondary | ICD-10-CM

## 2012-10-15 DIAGNOSIS — Z853 Personal history of malignant neoplasm of breast: Secondary | ICD-10-CM

## 2012-10-23 ENCOUNTER — Ambulatory Visit (INDEPENDENT_AMBULATORY_CARE_PROVIDER_SITE_OTHER): Payer: Medicare Other | Admitting: General Surgery

## 2012-10-23 ENCOUNTER — Encounter (INDEPENDENT_AMBULATORY_CARE_PROVIDER_SITE_OTHER): Payer: Self-pay | Admitting: General Surgery

## 2012-10-23 VITALS — BP 130/78 | HR 76 | Temp 97.8°F | Resp 16 | Ht 64.0 in | Wt 246.0 lb

## 2012-10-23 DIAGNOSIS — C50319 Malignant neoplasm of lower-inner quadrant of unspecified female breast: Secondary | ICD-10-CM

## 2012-10-23 NOTE — Patient Instructions (Signed)
Continue letrozole Continue regular self exams 

## 2012-10-23 NOTE — Progress Notes (Signed)
Subjective:     Patient ID: Heather Patton, female   DOB: [birth year], 66 y.o.   MRN: [id number]  HPI The patient is a 66 year old white female who is 10 months status post left breast lumpectomy and negative sentinel node biopsy for a T1 B. N0 left breast cancer. She continues to take letrozole everyday and is tolerating this well. She is due for her next mammogram now. She notes some continued soreness of the left breast. She denies any discharge from her nipple on either side.  Review of Systems  Constitutional: Negative.   HENT: Negative.   Eyes: Negative.   Respiratory: Negative.   Cardiovascular: Negative.   Gastrointestinal: Negative.   Genitourinary: Negative.   Musculoskeletal: Negative.   Skin: Negative.   Neurological: Negative.   Hematological: Negative.   Psychiatric/Behavioral: Negative.        Objective:   Physical Exam  Constitutional: She is oriented to person, place, and time. She appears well-developed and well-nourished.  HENT:  Head: Normocephalic and atraumatic.  Eyes: Conjunctivae normal and EOM are normal. Pupils are equal, round, and reactive to light.  Neck: Normal range of motion. Neck supple.  Cardiovascular: Normal rate, regular rhythm and normal heart sounds.   Pulmonary/Chest: Effort normal and breath sounds normal.       There is still some thickness to the scar in the left breast and the left breast in general secondary to radiation. There is no palpable mass in either breast. There is no palpable axillary or supraclavicular cervical lymphadenopathy.  Abdominal: Soft. Bowel sounds are normal. She exhibits no mass. There is no tenderness.  Musculoskeletal: Normal range of motion.  Lymphadenopathy:    She has no cervical adenopathy.  Neurological: She is alert and oriented to person, place, and time.  Skin: Skin is warm and dry.  Psychiatric: She has a normal mood and affect. Her behavior is normal.       Assessment:     10 months status post  left breast lumpectomy for breast cancer    Plan:     At this point she will continue to do regular self exams. She will continue to take letrozole daily. We will plan to see her back in about 3 or 4 months to check her progress.

## 2012-11-26 ENCOUNTER — Ambulatory Visit
Admission: RE | Admit: 2012-11-26 | Discharge: 2012-11-26 | Disposition: A | Discharge status: Home / Self Care | Payer: Medicare Other | Source: Ambulatory Visit | Attending: General Surgery | Admitting: General Surgery

## 2012-11-26 DIAGNOSIS — Z853 Personal history of malignant neoplasm of breast: Secondary | ICD-10-CM

## 2012-11-26 DIAGNOSIS — Z9889 Other specified postprocedural states: Secondary | ICD-10-CM

## 2012-11-30 ENCOUNTER — Telehealth: Payer: Self-pay | Admitting: Oncology

## 2012-11-30 NOTE — Telephone Encounter (Signed)
called pt and moved her appt to the pa      anne

## 2012-12-03 ENCOUNTER — Other Ambulatory Visit: Payer: Self-pay | Admitting: *Deleted

## 2012-12-03 DIAGNOSIS — C50319 Malignant neoplasm of lower-inner quadrant of unspecified female breast: Secondary | ICD-10-CM

## 2012-12-04 ENCOUNTER — Ambulatory Visit: Payer: BC Managed Care – PPO | Admitting: Oncology

## 2012-12-04 ENCOUNTER — Ambulatory Visit (HOSPITAL_BASED_OUTPATIENT_CLINIC_OR_DEPARTMENT_OTHER): Payer: Medicare Other | Admitting: Nurse Practitioner

## 2012-12-04 ENCOUNTER — Other Ambulatory Visit: Payer: BC Managed Care – PPO | Admitting: Lab

## 2012-12-04 ENCOUNTER — Other Ambulatory Visit (HOSPITAL_BASED_OUTPATIENT_CLINIC_OR_DEPARTMENT_OTHER): Payer: Medicare Other | Admitting: Lab

## 2012-12-04 ENCOUNTER — Telehealth: Payer: Self-pay | Admitting: Oncology

## 2012-12-04 VITALS — BP 137/81 | HR 76 | Temp 97.6°F | Resp 20 | Ht 64.0 in | Wt 243.4 lb

## 2012-12-04 DIAGNOSIS — C50319 Malignant neoplasm of lower-inner quadrant of unspecified female breast: Secondary | ICD-10-CM

## 2012-12-04 DIAGNOSIS — Z170 Estrogen receptor positive status [ER+]: Secondary | ICD-10-CM

## 2012-12-04 DIAGNOSIS — C50919 Malignant neoplasm of unspecified site of unspecified female breast: Secondary | ICD-10-CM

## 2012-12-04 LAB — COMPREHENSIVE METABOLIC PANEL (CC13)
ALT: 24 U/L (ref 0–55)
AST: 20 U/L (ref 5–34)
Alkaline Phosphatase: 94 U/L (ref 40–150)
Calcium: 10.1 mg/dL (ref 8.4–10.4)
Chloride: 106 mEq/L (ref 98–107)
Creatinine: 0.8 mg/dL (ref 0.6–1.1)
Potassium: 4.2 mEq/L (ref 3.5–5.1)

## 2012-12-04 LAB — CBC WITH DIFFERENTIAL/PLATELET
BASO%: 0.5 % (ref 0.0–2.0)
EOS%: 3.2 % (ref 0.0–7.0)
MCH: 28.9 pg (ref 25.1–34.0)
MCHC: 33.9 g/dL (ref 31.5–36.0)
NEUT%: 67.6 % (ref 38.4–76.8)
RDW: 13.1 % (ref 11.2–14.5)
lymph#: 1.4 10*3/uL (ref 0.9–3.3)

## 2012-12-04 NOTE — Telephone Encounter (Signed)
gv pt appt schedule for July 2014.

## 2012-12-04 NOTE — Progress Notes (Signed)
ID: Heather Patton   DOB: [birth year]  MR#: [id number]  WUJ#:[id number]  HISTORY OF PRESENT ILLNESS: The patient had routine screening mammography at the breast center 09/21/2011. A possible mass was noted in the left breast, and she was recalled for additional views November 21, 29012. Diagnostic mammography showed a low density spiculated mass in the lower outer quadrant of the left breast measuring 6 mm. This was not palpable to the mammographer. Ultrasound showed only normal tissue in the area in question.  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  With this information the patient underwent bilateral breast MRI 10/28/2011. A post biopsy hematoma was noted in the lower inner aspect of the left breast, with a small area of masslike enhancement associated with it. This area measured 7 mm. The MRI showed a second irregularly marginated mass in the central portion of the left breast 2.6 cm away from the biopsied mass. The second mass measured 5 mm maximally. There was no abnormality in the right breast and no adenopathy noted.  The patient was evaluated at the multidisciplinary breast clinic on 11/02/2011 and underwent definitive surgery 12/05/2011. Her subsequent history is as detailed below.  INTERVAL HISTORY: Patient presents today for routine scheduled follow up - she is doing well overall and continues to tolerate the letrozole without difficulty. She states she has had some mild joint pain but denies any other concerns.   REVIEW OF SYSTEMS: So far she has had absolutely no side effects from the letrozole. In particular she has had no hot flashes and no problems with vaginal dryness.  A detailed review of systems was otherwise entirely negative  PAST MEDICAL HISTORY: Past Medical History    Diagnosis Date  . Degenerative joint disease   . GERD (gastroesophageal reflux disease)   . Osteopenia   . Sleep apnea   . Hx of colonic polyps   . Hypothyroidism   . Hyperlipidemia   . Radiation 02/28/12  6300 cGy    left breast    PAST SURGICAL HISTORY: Past Surgical History  Procedure Date  . Tonsillectomy   . Abdominal hysterectomy   . Cervical disc surgery   . Bunionectomy 1985    lt  . Colonoscopy   . Breast biopsy 12/05/2011    left breast    FAMILY HISTORY Family History  Problem Relation Age of Onset  . Cancer Mother     breast  . Cancer Cousin     breast  . Heart disease Father   the patient's father died at the age of 25 from a myocardial infarction. The patient's mother was diagnosed with breast cancer at age 24, but she died at age 32 following a fall. The patient had no brothers, has one sister. The only other breast cancer diagnosis in the family was a cousin on the maternal side. The patient does not know at what age she was diagnosed. There is no history of ovarian cancer in the family.   GYNECOLOGIC HISTORY: GX P0. Menarche age 38, menopause 75 she took hormone replacement for about 10 years, discontinuing that in 2006.   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 The patient is a close friend of one of my patients, Kijana Estock    ADVANCED DIRECTIVES: in place  HEALTH  MAINTENANCE: History  Substance Use Topics  . Smoking status: Former Smoker -- 0.5 packs/day    Quit date: 11/21/1978  . Smokeless tobacco: Never Used  . Alcohol Use: 3.6 oz/week    6 Glasses of wine per week     Colonoscopy: 2010/[facility]  PAP:UTD/Lomax  Bone density: Sept 2012/ Lomax  Lipid panel: [facility]  No Known Allergies  Current Outpatient Prescriptions  Medication Sig Dispense Refill  . calcium citrate-vitamin D (CITRACAL+D) 315-200 MG-UNIT per tablet Take 1 tablet by mouth daily. Calcium citrate 630mg      Vitamin D  500iu       . docusate sodium (COLACE) 100 MG capsule Take 100 mg by mouth daily.       Marland Kitchen ezetimibe-simvastatin (VYTORIN) 10-80 MG per tablet Take 1 tablet by mouth at bedtime.        Marland Kitchen letrozole (FEMARA) 2.5 MG tablet Take 1 tablet (2.5 mg total) by mouth daily.  90 tablet  12  . levothyroxine (SYNTHROID, LEVOTHROID) 150 MCG tablet Take 150 mcg by mouth daily.        . meloxicam (MOBIC) 15 MG tablet       . nystatin-triamcinolone (MYCOLOG II) cream         OBJECTIVE: Middle-aged white woman who appears well Filed Vitals:   12/04/12 1039  BP: 137/81  Pulse: 76  Temp: 97.6 F (36.4 C)  Resp: 20     Body mass index is 41.78 kg/(m^2).    ECOG FS: 0  Sclerae unicteric, EOMI Oropharynx clear No cervical, axillary, inguinal,  or supraclavicular adenopathy Lungs no rales or rhonchi, clear to ausculation and percussion Heart regular rate and rhythm Abd benign MSK no focal spinal tenderness, no peripheral edema Neuro: nonfocal Breasts: The right breast is unremarkable. The left breast is status post lumpectomy and radiation. There is almost no hyperpigmentation. The cosmetic result is excellent. There are no skin or nipple changes of concern, no palpable masses, and no evidence of recurrence.  LAB RESULTS: Lab Results  Component Value Date   WBC 6.4 12/04/2012   NEUTROABS 4.3 12/04/2012   HGB 14.2 12/04/2012   HCT 41.8 12/04/2012   MCV 85.2 12/04/2012   PLT 222 12/04/2012      Chemistry      Component Value Date/Time   NA 141 12/04/2012 1023   NA 140 11/02/2011 1219   K 4.2 12/04/2012 1023   K 3.7 11/02/2011 1219   CL 106 12/04/2012 1023   CL 104 11/02/2011 1219   CO2 25 12/04/2012 1023   CO2 26 11/02/2011 1219   BUN 15.0 12/04/2012 1023   BUN 13 11/02/2011 1219   CREATININE 0.8 12/04/2012 1023   CREATININE 0.69 11/02/2011 1219      Component Value Date/Time   CALCIUM 10.1 12/04/2012 1023   CALCIUM 9.9 11/02/2011 1219   ALKPHOS 94 12/04/2012 1023   ALKPHOS 92  11/02/2011 1219   AST 20 12/04/2012 1023   AST 20 11/02/2011 1219   ALT 24 12/04/2012 1023   ALT 23 11/02/2011 1219   BILITOT 0.54 12/04/2012 1023   BILITOT 0.4 11/02/2011 1219       Lab Results  Component Value Date   LABCA2 27 11/02/2011      Radiology: Last mammogram pm 11/26/2012  was unremarkable   ASSESSMENT: 67 y.o. 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 She completed radiation April 2013, and started letrozole at that time.  PLAN: She is doing remarkably well so far with the letrozole.  The plan remains to see her every 6 months for the next year and then yearly until she completes her 5 years of letrozole. Next bone density will be due in the Fall of 2014.She knows to call for any problems that may develop before the next visit. All of her questions were answered to her satisfaction.   JOHNSON,SARAHAOCNP, NP-C      12/04/2012

## 2012-12-19 IMAGING — MG MM BREAST WIRE LOCALIZATION*L*
8 of 12 series · 8 of 12 positions shown · non-contrast
Comparison: none

CLINICAL DATA: Stereotactic needle biopsy 10/21/2011 revealing
invasive lobular carcinoma in the lower inner left breast.  MRI
needle biopsy 11/07/2011 revealing atypical lobular hyperplasia in
the central left breast.

[L CC (1 of 4)]
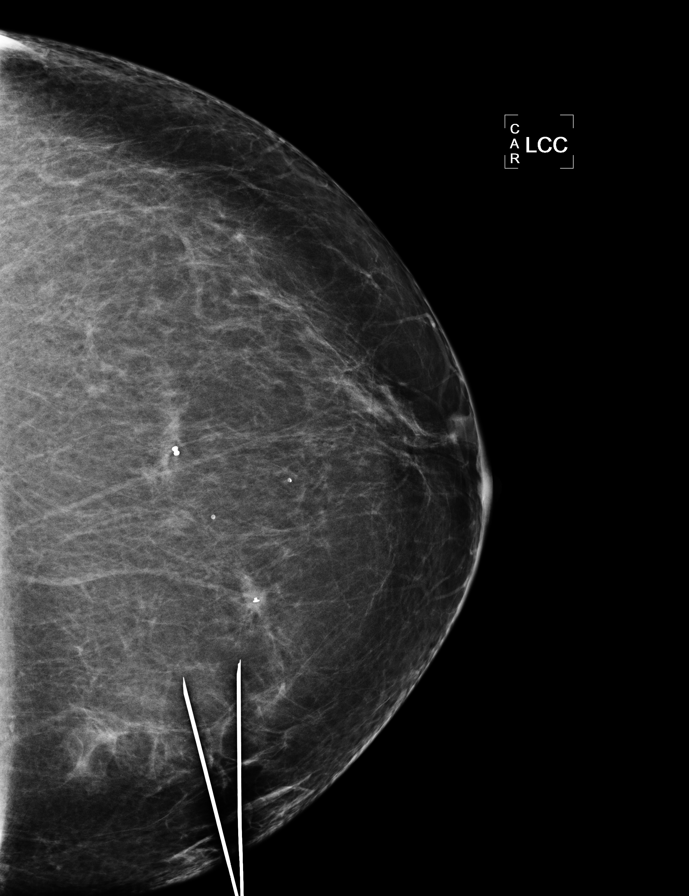

[L ML (1 of 4)]
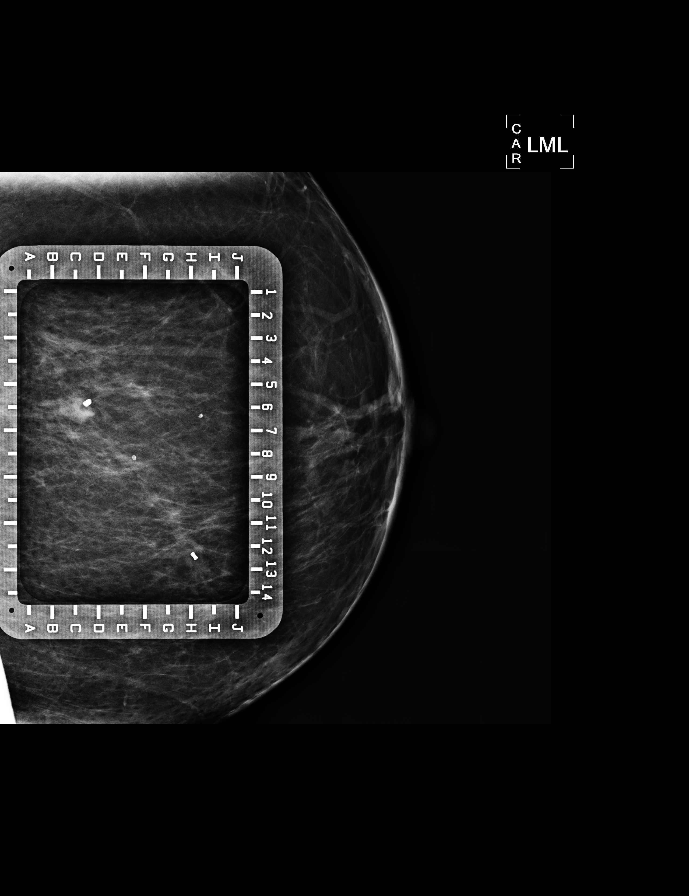

[L CC (2 of 4)]
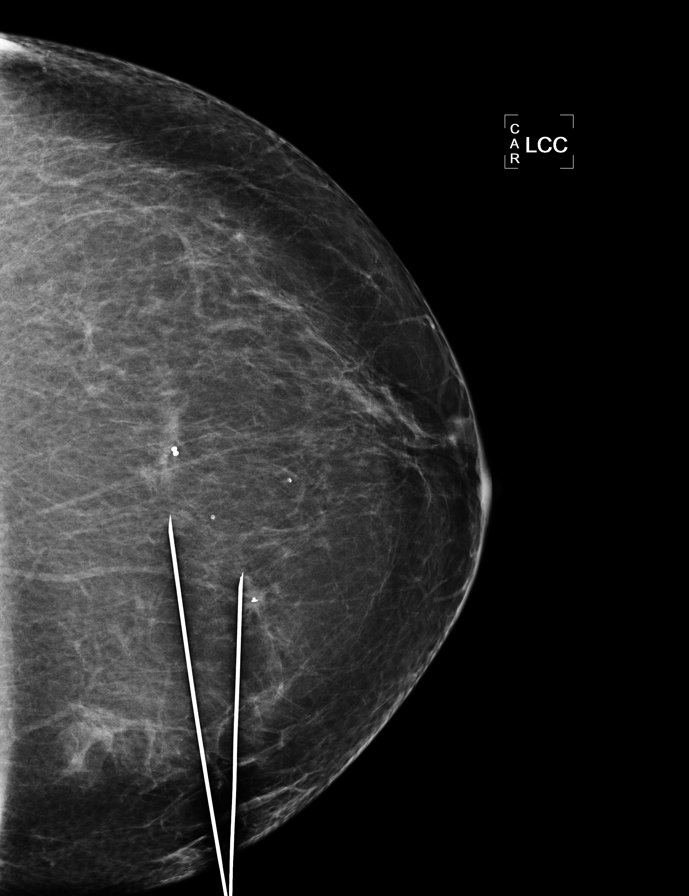

[L ML (2 of 4)]
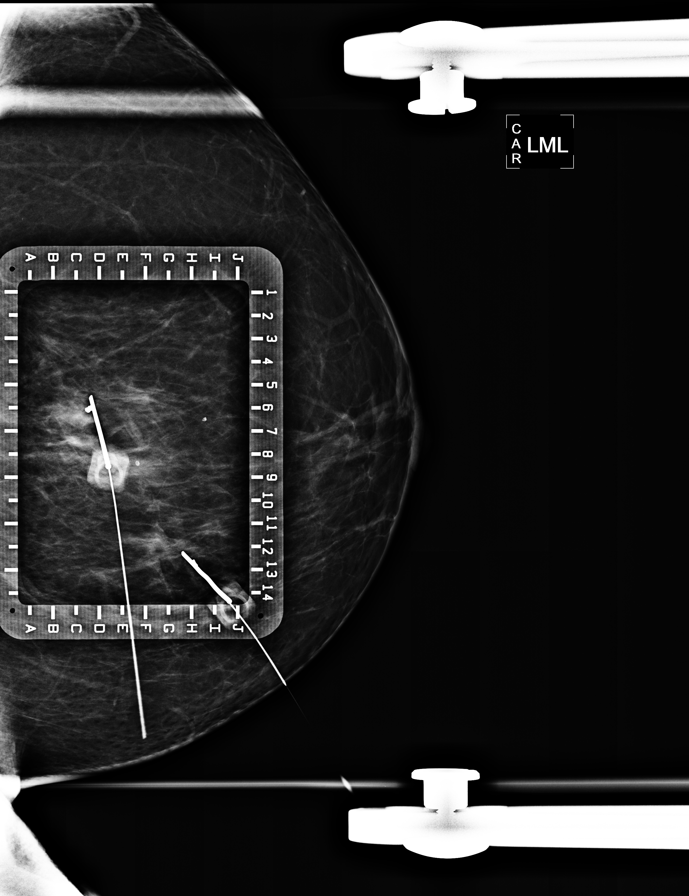

[L CC (3 of 4)]
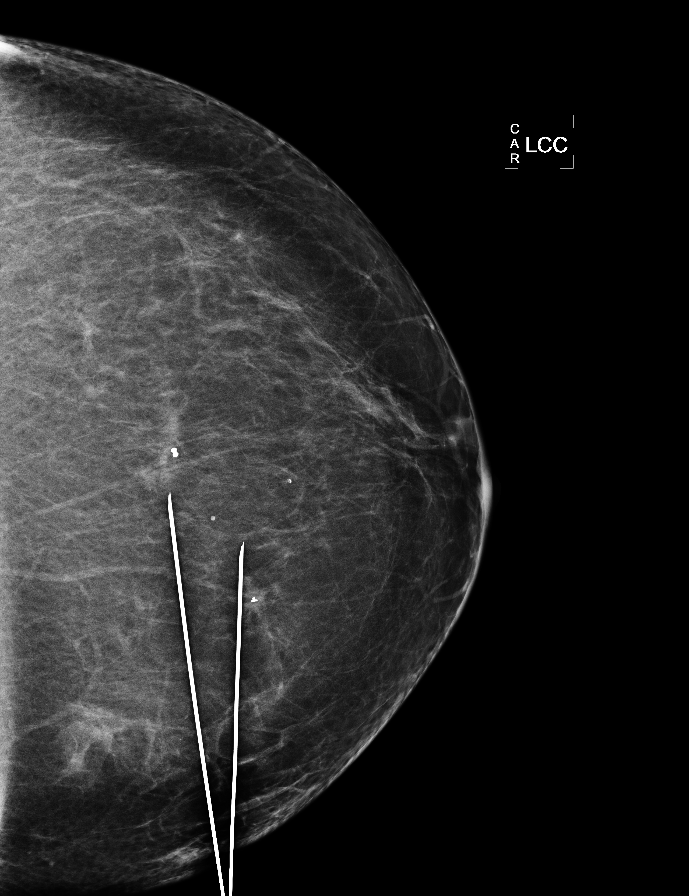

[L ML (3 of 4)]
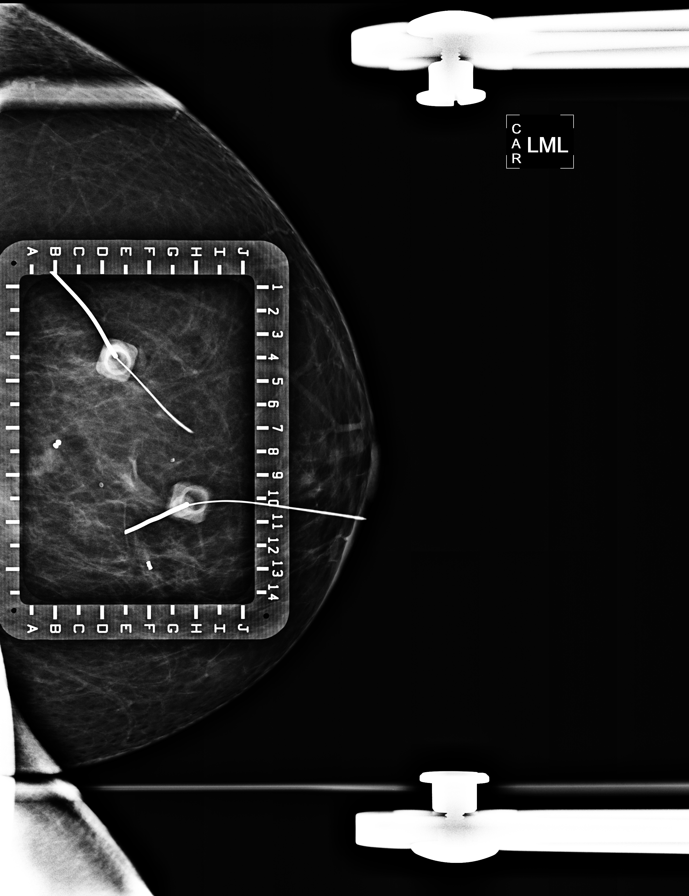

[L CC (4 of 4)]
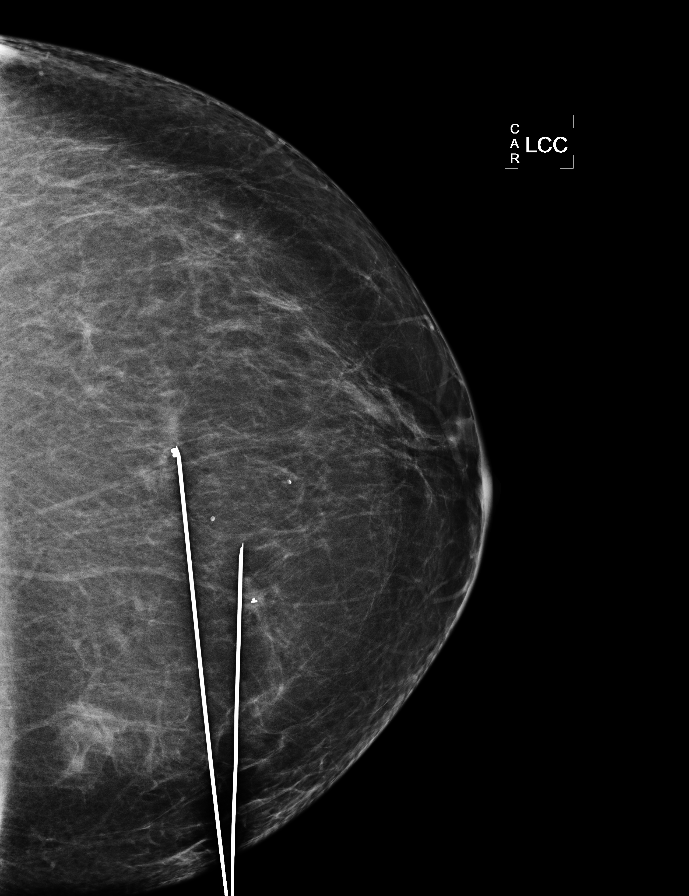

[L ML (4 of 4)]
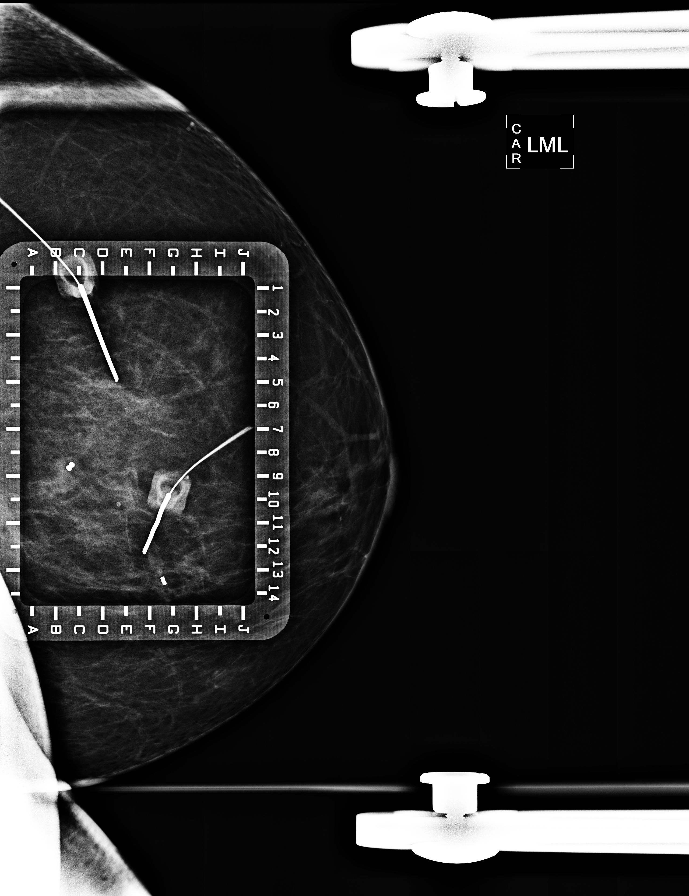

[8 of 12 positions shown; findings below may reference images not displayed]

NEEDLE LOCALIZATION WITH MAMMOGRAPHIC GUIDANCE (x 2) AND SPECIMEN
RADIOGRAPH (x 1) 12/05/2011:

Patient presents for needle localization prior to lumpectomy.  I
met with the patient and we discussed the procedure of needle
localization including benefits and alternatives. We discussed the
high likelihood of a successful procedure. We discussed the risks
of the procedure, including infection, bleeding, tissue injury, and
further surgery. Informed, written consent was given.

Using mammographic guidance, sterile technique, 2% lidocaine, and a
7 cm modified Kopans needle, the invasive lobular carcinoma in the
lower inner left breast was localized using a medial approach.
Subsequently, again using mammographic guidance, sterile technique,
2% lidocaine, and and 11 cm modified Kopans needle, the atypical
lobular hyperplasia in the deep central left breast was localized
using a medial approach.  The films are marked for Dr. Bendeck.

Specimen radiograph was performed at [facility], and confirms that
both mammographic clips and the density associated with the
invasive lobular carcinoma are present in the tissue sample.  The
specimen is marked for pathology.
IMPRESSION: Needle localization left breast.  No apparent complications.

## 2013-03-04 ENCOUNTER — Encounter (INDEPENDENT_AMBULATORY_CARE_PROVIDER_SITE_OTHER): Payer: Self-pay | Admitting: General Surgery

## 2013-03-04 ENCOUNTER — Ambulatory Visit (INDEPENDENT_AMBULATORY_CARE_PROVIDER_SITE_OTHER): Payer: Medicare Other | Admitting: General Surgery

## 2013-03-04 VITALS — BP 130/78 | HR 77 | Temp 96.6°F | Ht 64.0 in | Wt 235.4 lb

## 2013-03-04 DIAGNOSIS — C50319 Malignant neoplasm of lower-inner quadrant of unspecified female breast: Secondary | ICD-10-CM

## 2013-03-04 DIAGNOSIS — C50312 Malignant neoplasm of lower-inner quadrant of left female breast: Secondary | ICD-10-CM

## 2013-03-04 NOTE — Progress Notes (Signed)
Subjective:     Patient ID: Heather Patton, female   DOB: 1946-01-25, 67 y.o.   MRN: [id number]  HPI The patient is a 67 year old white female who is about one year and 3 months status post left breast lumpectomy for a T1 B. N0 left breast cancer. She is taking letrozole and tolerating that well. Her only complaint is of some soreness in the left breast. She recently had a mammogram that showed no evidence of malignancy  Review of Systems  Constitutional: Negative.   HENT: Negative.   Eyes: Negative.   Respiratory: Negative.   Cardiovascular: Negative.   Gastrointestinal: Negative.   Endocrine: Negative.   Genitourinary: Negative.   Musculoskeletal: Negative.   Skin: Negative.   Allergic/Immunologic: Negative.   Neurological: Negative.   Hematological: Negative.   Psychiatric/Behavioral: Negative.        Objective:   Physical Exam  Constitutional: She is oriented to person, place, and time. She appears well-developed and well-nourished.  HENT:  Head: Normocephalic and atraumatic.  Eyes: Conjunctivae and EOM are normal. Pupils are equal, round, and reactive to light.  Neck: Normal range of motion. Neck supple.  Cardiovascular: Normal rate, regular rhythm and normal heart sounds.   Pulmonary/Chest: Effort normal and breath sounds normal.  There is some thickness to the scar in the left breast secondary to radiation. Otherwise there is no palpable mass in either breast. There is no palpable axillary or supraclavicular cervical lymphadenopathy.  Abdominal: Soft. Bowel sounds are normal. She exhibits no mass. There is no tenderness.  Musculoskeletal: Normal range of motion.  Lymphadenopathy:    She has no cervical adenopathy.  Neurological: She is alert and oriented to person, place, and time.  Skin: Skin is warm and dry.  Psychiatric: She has a normal mood and affect. Her behavior is normal.       Assessment:     The patient is one year and 3 months status post left breast  lumpectomy for breast cancer     Plan:     At this point she will continue to do regular self exams. She will continue to take letrozole. We will plan to see her back in about 6 months.

## 2013-03-04 NOTE — Patient Instructions (Signed)
Continue regular self exams Continue letrozole 

## 2013-05-15 ENCOUNTER — Other Ambulatory Visit: Payer: Self-pay | Admitting: Dermatology

## 2013-05-16 ENCOUNTER — Other Ambulatory Visit: Payer: Self-pay | Admitting: *Deleted

## 2013-05-16 DIAGNOSIS — C50919 Malignant neoplasm of unspecified site of unspecified female breast: Secondary | ICD-10-CM

## 2013-05-16 MED ORDER — LETROZOLE 2.5 MG PO TABS
2.5000 mg | ORAL_TABLET | Freq: Every day | ORAL | Status: DC
Start: 2013-05-16 — End: 2013-06-03

## 2013-06-03 ENCOUNTER — Telehealth: Payer: Self-pay | Admitting: *Deleted

## 2013-06-03 ENCOUNTER — Ambulatory Visit (HOSPITAL_BASED_OUTPATIENT_CLINIC_OR_DEPARTMENT_OTHER): Payer: Medicare Other | Admitting: Oncology

## 2013-06-03 ENCOUNTER — Other Ambulatory Visit (HOSPITAL_BASED_OUTPATIENT_CLINIC_OR_DEPARTMENT_OTHER): Payer: Medicare Other | Admitting: Lab

## 2013-06-03 VITALS — BP 130/83 | HR 71 | Temp 98.5°F | Resp 20 | Ht 64.0 in | Wt 220.2 lb

## 2013-06-03 DIAGNOSIS — C50319 Malignant neoplasm of lower-inner quadrant of unspecified female breast: Secondary | ICD-10-CM

## 2013-06-03 DIAGNOSIS — C50919 Malignant neoplasm of unspecified site of unspecified female breast: Secondary | ICD-10-CM

## 2013-06-03 LAB — CBC WITH DIFFERENTIAL/PLATELET
BASO%: 0.8 % (ref 0.0–2.0)
EOS%: 3.7 % (ref 0.0–7.0)
HCT: 42.9 % (ref 34.8–46.6)
LYMPH%: 23.4 % (ref 14.0–49.7)
MCH: 29.4 pg (ref 25.1–34.0)
MCHC: 34.5 g/dL (ref 31.5–36.0)
MCV: 85.4 fL (ref 79.5–101.0)
MONO%: 9.1 % (ref 0.0–14.0)
NEUT%: 63 % (ref 38.4–76.8)
lymph#: 1.3 10*3/uL (ref 0.9–3.3)

## 2013-06-03 LAB — COMPREHENSIVE METABOLIC PANEL (CC13)
ALT: 26 U/L (ref 0–55)
AST: 21 U/L (ref 5–34)
Alkaline Phosphatase: 91 U/L (ref 40–150)
BUN: 14.9 mg/dL (ref 7.0–26.0)
Creatinine: 0.8 mg/dL (ref 0.6–1.1)
Total Bilirubin: 0.61 mg/dL (ref 0.20–1.20)

## 2013-06-03 MED ORDER — LETROZOLE 2.5 MG PO TABS
2.5000 mg | ORAL_TABLET | Freq: Every day | ORAL | Status: DC
Start: 2013-06-03 — End: 2014-08-08

## 2013-06-03 MED ORDER — LETROZOLE 2.5 MG PO TABS: 2.5000 mg | ORAL_TABLET | Freq: Every day | ORAL | Status: DC

## 2013-06-03 NOTE — Telephone Encounter (Signed)
appts made and printed. Pt requested to have her labs completed the same day as her ov...td

## 2013-06-03 NOTE — Progress Notes (Signed)
ID: [name]   DOB: October 29, 1946  MR#: [id number]  WUJ#:[id number]  PCP: Julian Hy, MD GYN: SUFelicity Pellegrini OTHER MD: Antony Blackbird   HISTORY OF PRESENT ILLNESS: The patient had routine screening mammography at the breast center 09/21/2011. A possible mass was noted in the left breast, and she was recalled for additional views November 21, 29012. Diagnostic mammography showed a low density spiculated mass in the lower outer quadrant of the left breast measuring 6 mm. This was not palpable to the mammographer. Ultrasound showed only normal tissue in the area in question.  Stereotactic biopsy was performed 10/21/2011 and showed [phone] an invasive carcinoma with lobular features grade 1 or 2, with no her 2 amplification, 99% estrogen receptor positive, 94% progesterone receptor positive, with an MIB-1-1 of 15%.  With this information the patient underwent bilateral breast MRI 10/28/2011. A post biopsy hematoma was noted in the lower inner aspect of the left breast, with a small area of masslike enhancement associated with it. This area measured 7 mm. The MRI showed a second irregularly marginated mass in the central portion of the left breast 2.6 cm away from the biopsied mass. The second mass measured 5 mm maximally. There was no abnormality in the right breast and no adenopathy noted.  The patient was evaluated at the multidisciplinary breast clinic on 11/02/2011 and underwent definitive surgery 12/05/2011. Her subsequent history is as detailed below.  INTERVAL HISTORY: Mother returns today for followup of her breast cancer. She is tolerating letrozole with no significant side effects and in particular no hot flashes, no worsening of her mild vaginal dryness, and no unusual arthralgias or myalgias.  REVIEW OF SYSTEMS: She has mild arthritis aches and pains here in there which are not more intense or persistent than before. Otherwise it detailed review of systems is unremarkable. She is  exercising regularly and is dieting chiefly by avoiding carbohydrates.  PAST MEDICAL HISTORY: Past Medical History  Diagnosis Date  . Degenerative joint disease   . GERD (gastroesophageal reflux disease)   . Osteopenia   . Sleep apnea   . Hx of colonic polyps   . Hypothyroidism   . Hyperlipidemia   . Radiation 02/28/12  6300 cGy    left breast    PAST SURGICAL HISTORY: Past Surgical History  Procedure Laterality Date  . Tonsillectomy    . Abdominal hysterectomy    . Cervical disc surgery    . Bunionectomy  1985    lt  . Colonoscopy    . Breast biopsy  12/05/2011    left breast    FAMILY HISTORY Family History  Problem Relation Age of Onset  . Cancer Mother     breast  . Cancer Cousin     breast  . Heart disease Father   the patient's father died at the age of 10 from a myocardial infarction. The patient's mother was diagnosed with breast cancer at age 16, but she died at age 61 following a fall. The patient had no brothers, has one sister. The only other breast cancer diagnosis in the family was a cousin on the maternal side. The patient does not know at what age she was diagnosed. There is no history of ovarian cancer in the family.   GYNECOLOGIC HISTORY: GX P0. Menarche age 39, menopause 8 she took hormone replacement for about 10 years, discontinuing that in 2006.   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 Her husband Elijah Birk is retired from the [employer]  business. The patient is a close friend of one of my patients, Heidee Audi    ADVANCED DIRECTIVES: in place  HEALTH MAINTENANCE: History  Substance Use Topics  . Smoking status: Former Smoker -- 0.50 packs/day    Quit date: 11/21/1978  . Smokeless tobacco: Never Used  . Alcohol Use: 3.6 oz/week    6 Glasses of wine per week     Colonoscopy: 2010/[facility]  PAP:UTD/[facility]  Bone density: Sept 2012/ [facility]  Lipid panel: [place]  No Known Allergies  Current Outpatient Prescriptions  Medication Sig  Dispense Refill  . calcium citrate-vitamin D (CITRACAL+D) 315-200 MG-UNIT per tablet Take 1 tablet by mouth daily. Calcium citrate 630mg     Vitamin D  500iu       . docusate sodium (COLACE) 100 MG capsule Take 100 mg by mouth daily.       Marland Kitchen ezetimibe-simvastatin (VYTORIN) 10-80 MG per tablet Take 1 tablet by mouth at bedtime.        Marland Kitchen letrozole (FEMARA) 2.5 MG tablet Take 1 tablet (2.5 mg total) by mouth daily.  90 tablet  3  . letrozole (FEMARA) 2.5 MG tablet Take 1 tablet (2.5 mg total) by mouth daily.  90 tablet  3  . levothyroxine (SYNTHROID, LEVOTHROID) 150 MCG tablet Take 150 mcg by mouth daily.        . meloxicam (MOBIC) 15 MG tablet       . nystatin-triamcinolone (MYCOLOG II) cream        No current facility-administered medications for this visit.    OBJECTIVE: Middle-aged white woman who appears well Filed Vitals:   06/03/13 1101  BP: 130/83  Pulse: 71  Temp: 98.5 F (36.9 C)  Resp: 20     Body mass index is 37.78 kg/(m^2).    ECOG FS: 0  Sclerae unicteric Oropharynx clear No cervical or supraclavicular adenopathy Lungs no rales or rhonchi Heart regular rate and rhythm Abd benign MSK no focal spinal tenderness, no peripheral edema Neuro: nonfocal Breasts: The right breast is unremarkable. The left breast is status post lumpectomy and radiation. There is no evidence of recurrence. The left axilla is benign  LAB RESULTS: Lab Results  Component Value Date   WBC 5.7 06/03/2013   NEUTROABS 3.6 06/03/2013   HGB 14.8 06/03/2013   HCT 42.9 06/03/2013   MCV 85.4 06/03/2013   PLT 217 06/03/2013      Chemistry      Component Value Date/Time   NA 141 12/04/2012 1023   NA 140 11/02/2011 1219   K 4.2 12/04/2012 1023   K 3.7 11/02/2011 1219   CL 106 12/04/2012 1023   CL 104 11/02/2011 1219   CO2 25 12/04/2012 1023   CO2 26 11/02/2011 1219   BUN 15.0 12/04/2012 1023   BUN 13 11/02/2011 1219   CREATININE 0.8 12/04/2012 1023   CREATININE 0.69 11/02/2011 1219      Component  Value Date/Time   CALCIUM 10.1 12/04/2012 1023   CALCIUM 9.9 11/02/2011 1219   ALKPHOS 94 12/04/2012 1023   ALKPHOS 92 11/02/2011 1219   AST 20 12/04/2012 1023   AST 20 11/02/2011 1219   ALT 24 12/04/2012 1023   ALT 23 11/02/2011 1219   BILITOT 0.54 12/04/2012 1023   BILITOT 0.4 11/02/2011 1219       Lab Results  Component Value Date   LABCA2 27 11/02/2011    No components found with this basename: [id number]    No results found for this basename: INR,  in  the last 168 hours  Urinalysis No results found for this basename: colorurine,  appearanceur,  labspec,  phurine,  glucoseu,  hgbur,  bilirubinur,  ketonesur,  proteinur,  urobilinogen,  nitrite,  leukocytesur    STUDIES: Mammography 11/26/2012 was unremarkable. Repeat bone density is due this fall  ASSESSMENT: 67 y.o. 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 She completed radiation April 2013, and started letrozole at that time.  PLAN: Latrisa is doing great from a breast cancer point of view and the plan is to continue letrozole for a total of 5 years. She will see [place] again January of 2015 and from that point we will start seeing her on a once a year basis. If there is a significant change in her bone density we will consider bisphosphonates.  She knows to call for any problems that may develop before the next visit.  Jedi Catalfamo C    06/03/2013

## 2013-06-11 ENCOUNTER — Ambulatory Visit: Payer: Self-pay | Admitting: Neurology

## 2013-07-31 ENCOUNTER — Encounter (INDEPENDENT_AMBULATORY_CARE_PROVIDER_SITE_OTHER): Payer: Self-pay | Admitting: General Surgery

## 2013-09-06 ENCOUNTER — Ambulatory Visit (INDEPENDENT_AMBULATORY_CARE_PROVIDER_SITE_OTHER): Payer: Medicare Other | Admitting: General Surgery

## 2013-09-11 ENCOUNTER — Ambulatory Visit (INDEPENDENT_AMBULATORY_CARE_PROVIDER_SITE_OTHER): Payer: Medicare Other | Admitting: General Surgery

## 2013-09-11 ENCOUNTER — Encounter (INDEPENDENT_AMBULATORY_CARE_PROVIDER_SITE_OTHER): Payer: Self-pay | Admitting: General Surgery

## 2013-09-11 VITALS — BP 130/70 | HR 76 | Temp 99.1°F | Resp 15 | Ht 64.0 in | Wt 217.8 lb

## 2013-09-11 DIAGNOSIS — C50912 Malignant neoplasm of unspecified site of left female breast: Secondary | ICD-10-CM

## 2013-09-11 DIAGNOSIS — C50919 Malignant neoplasm of unspecified site of unspecified female breast: Secondary | ICD-10-CM

## 2013-09-11 NOTE — Patient Instructions (Signed)
Continue femara Continue regular self exams 

## 2013-09-11 NOTE — Progress Notes (Signed)
Subjective:     Patient ID: Heather Patton, female   DOB: 03-21-1946, 67 y.o.   MRN: [id number]  HPI The patient is a 67 year old white female who is one year and 9 months status post left breast lumpectomy and negative sentinel node biopsy for a T1bN0 left breast cancer. She received radiation therapy and is now taking letrozole. She seems to be tolerating that well. She continues to have the same tenderness in the left breast which we believe is secondary to the radiation. Otherwise she has been well.  Review of Systems  Constitutional: Negative.   HENT: Negative.   Eyes: Negative.   Respiratory: Negative.   Cardiovascular: Negative.   Gastrointestinal: Negative.   Endocrine: Negative.   Genitourinary: Negative.   Musculoskeletal: Negative.   Skin: Negative.   Allergic/Immunologic: Negative.   Neurological: Negative.   Hematological: Negative.   Psychiatric/Behavioral: Negative.        Objective:   Physical Exam  Constitutional: She is oriented to person, place, and time. She appears well-developed and well-nourished.  HENT:  Head: Normocephalic and atraumatic.  Eyes: Conjunctivae and EOM are normal. Pupils are equal, round, and reactive to light.  Neck: Normal range of motion. Neck supple.  Cardiovascular: Normal rate, regular rhythm and normal heart sounds.   Pulmonary/Chest: Effort normal and breath sounds normal.  The patient has some fullness to her scar in her left breast. Other than this there is no palpable mass in either breast. There is no palpable axillary, supraclavicular, or cervical lymphadenopathy  Abdominal: Soft. Bowel sounds are normal. She exhibits no mass. There is no tenderness.  Musculoskeletal: Normal range of motion.  Lymphadenopathy:    She has no cervical adenopathy.  Neurological: She is alert and oriented to person, place, and time.  Skin: Skin is warm and dry.  Psychiatric: She has a normal mood and affect. Her behavior is normal.        Assessment:     The patient is one year and 9 months status post left breast lumpectomy for breast cancer     Plan:     At this point she will continue to do regular self exams. She will continue to take letrozole. We will plan to see her back in about 6 months. She will be due for her next mammogram in January.

## 2013-10-22 ENCOUNTER — Other Ambulatory Visit (INDEPENDENT_AMBULATORY_CARE_PROVIDER_SITE_OTHER): Payer: Self-pay | Admitting: General Surgery

## 2013-10-22 DIAGNOSIS — Z853 Personal history of malignant neoplasm of breast: Secondary | ICD-10-CM

## 2013-10-22 DIAGNOSIS — Z9889 Other specified postprocedural states: Secondary | ICD-10-CM

## 2013-11-28 ENCOUNTER — Ambulatory Visit
Admission: RE | Admit: 2013-11-28 | Discharge: 2013-11-28 | Disposition: A | Discharge status: Home / Self Care | Payer: Medicare Other | Source: Ambulatory Visit | Attending: General Surgery | Admitting: General Surgery

## 2013-11-28 DIAGNOSIS — Z853 Personal history of malignant neoplasm of breast: Secondary | ICD-10-CM

## 2013-11-28 DIAGNOSIS — Z9889 Other specified postprocedural states: Secondary | ICD-10-CM

## 2013-12-16 ENCOUNTER — Ambulatory Visit (HOSPITAL_BASED_OUTPATIENT_CLINIC_OR_DEPARTMENT_OTHER): Payer: Medicare Other | Admitting: Physician Assistant

## 2013-12-16 ENCOUNTER — Encounter (INDEPENDENT_AMBULATORY_CARE_PROVIDER_SITE_OTHER): Payer: Self-pay

## 2013-12-16 ENCOUNTER — Other Ambulatory Visit (HOSPITAL_BASED_OUTPATIENT_CLINIC_OR_DEPARTMENT_OTHER): Payer: Medicare Other

## 2013-12-16 ENCOUNTER — Encounter: Payer: Self-pay | Admitting: Physician Assistant

## 2013-12-16 ENCOUNTER — Telehealth: Payer: Self-pay | Admitting: Oncology

## 2013-12-16 VITALS — BP 136/80 | HR 71 | Temp 98.3°F | Resp 18 | Ht 64.0 in | Wt 216.3 lb

## 2013-12-16 DIAGNOSIS — C50319 Malignant neoplasm of lower-inner quadrant of unspecified female breast: Secondary | ICD-10-CM

## 2013-12-16 DIAGNOSIS — C50912 Malignant neoplasm of unspecified site of left female breast: Secondary | ICD-10-CM

## 2013-12-16 DIAGNOSIS — Z853 Personal history of malignant neoplasm of breast: Secondary | ICD-10-CM

## 2013-12-16 DIAGNOSIS — C50919 Malignant neoplasm of unspecified site of unspecified female breast: Secondary | ICD-10-CM

## 2013-12-16 LAB — CBC WITH DIFFERENTIAL/PLATELET
BASO%: 0.7 % (ref 0.0–2.0)
Basophils Absolute: 0 10*3/uL (ref 0.0–0.1)
EOS ABS: 0.2 10*3/uL (ref 0.0–0.5)
EOS%: 2.5 % (ref 0.0–7.0)
HCT: 43.9 % (ref 34.8–46.6)
HGB: 14.8 g/dL (ref 11.6–15.9)
LYMPH#: 1.8 10*3/uL (ref 0.9–3.3)
LYMPH%: 27 % (ref 14.0–49.7)
MCH: 29.4 pg (ref 25.1–34.0)
MCHC: 33.8 g/dL (ref 31.5–36.0)
MCV: 86.8 fL (ref 79.5–101.0)
MONO#: 0.5 10*3/uL (ref 0.1–0.9)
MONO%: 8 % (ref 0.0–14.0)
NEUT%: 61.8 % (ref 38.4–76.8)
NEUTROS ABS: 4.1 10*3/uL (ref 1.5–6.5)
Platelets: 235 10*3/uL (ref 145–400)
RBC: 5.05 10*6/uL (ref 3.70–5.45)
RDW: 13 % (ref 11.2–14.5)
WBC: 6.7 10*3/uL (ref 3.9–10.3)

## 2013-12-16 LAB — COMPREHENSIVE METABOLIC PANEL (CC13)
ALBUMIN: 4.1 g/dL (ref 3.5–5.0)
ALK PHOS: 82 U/L (ref 40–150)
ALT: 20 U/L (ref 0–55)
AST: 17 U/L (ref 5–34)
Anion Gap: 10 mEq/L (ref 3–11)
BUN: 15.7 mg/dL (ref 7.0–26.0)
CHLORIDE: 105 meq/L (ref 98–109)
CO2: 25 mEq/L (ref 22–29)
Calcium: 10.1 mg/dL (ref 8.4–10.4)
Creatinine: 0.8 mg/dL (ref 0.6–1.1)
GLUCOSE: 100 mg/dL (ref 70–140)
POTASSIUM: 4.2 meq/L (ref 3.5–5.1)
SODIUM: 140 meq/L (ref 136–145)
TOTAL PROTEIN: 7.2 g/dL (ref 6.4–8.3)
Total Bilirubin: 0.64 mg/dL (ref 0.20–1.20)

## 2013-12-16 NOTE — Progress Notes (Signed)
ID: Heather Patton   DOB: [birth year]  MR#: [id number]  CSN#:[id number]  PCP: Heather Stack, MD GYN: SUStar Age, MD OTHER MD: Heather Pray, MD;  Heather Essex, MD  CHIEF COMPLAINT:  Hx of Left Breast Cancer   HISTORY OF PRESENT ILLNESS: The patient had routine screening mammography at the breast center 09/21/2011. A possible mass was noted in the left breast, and she was recalled for additional views November 21, 29012. Diagnostic mammography showed a low density spiculated mass in the lower outer quadrant of the left breast measuring 6 mm. This was not palpable to the mammographer. Ultrasound showed only normal tissue in the area in question.  Stereotactic biopsy was performed 10/21/2011 and showed [phone] an invasive carcinoma with lobular features grade 1 or 2, with no her 2 amplification, 99% estrogen receptor positive, 94% progesterone receptor positive, with an MIB-1-1 of 15%.  With this information the patient underwent bilateral breast MRI 10/28/2011. A post biopsy hematoma was noted in the lower inner aspect of the left breast, with a small area of masslike enhancement associated with it. This area measured 7 mm. The MRI showed a second irregularly marginated mass in the central portion of the left breast 2.6 cm away from the biopsied mass. The second mass measured 5 mm maximally. There was no abnormality in the right breast and no adenopathy noted.  The patient was evaluated at the multidisciplinary breast clinic on 11/02/2011 and underwent definitive surgery 12/05/2011. Her subsequent history is as detailed below.  INTERVAL HISTORY: Heather Patton returns alone today for routine followup of her left breast cancer. She continues on letrozole which she is tolerating well. In fact she tells me she "doesn't even know she is taking it". She's had no significant problems with hot flashes. No increased vaginal dryness. She has some chronic aches and pains associated with mild arthritis, but does  not find that these have worsened since beginning the letrozole.  In fact otherwise, interval history is quite unremarkable.   REVIEW OF SYSTEMS: Heather Patton denies any recent illnesses and has had no fevers or chills. She denies any rashes or skin changes and has had no abnormal bleeding. Her energy level is good, as is her appetite. She denies any nausea or change in bowel or bladder habits. She's had no cough, increased shortness of breath, chest pain, or palpitations. No abnormal headaches or dizziness, and no change in vision. Currently she denies any unusual myalgias, arthralgias, or bony pain. She occasionally has some slight swelling in the feet and ankles at night, but this resolves by the next morning.  A detailed review of systems is otherwise stable and noncontributory.   PAST MEDICAL HISTORY: Past Medical History  Diagnosis Date  . Degenerative joint disease   . GERD (gastroesophageal reflux disease)   . Osteopenia   . Sleep apnea   . Hx of colonic polyps   . Hypothyroidism   . Hyperlipidemia   . Radiation 02/28/12  6300 cGy    left breast    PAST SURGICAL HISTORY: Past Surgical History  Procedure Laterality Date  . Tonsillectomy    . Abdominal hysterectomy    . Cervical disc surgery    . Bunionectomy  1985    lt  . Colonoscopy    . Breast biopsy  12/05/2011    left breast    FAMILY HISTORY Family History  Problem Relation Age of Onset  . Cancer Mother     breast  . Cancer Cousin  breast  . Heart disease Father   the patient's father died at the age of 39 from a myocardial infarction. The patient's mother was diagnosed with breast cancer at age 50, but she died at age 31 following a fall. The patient had no brothers, has one sister. The only other breast cancer diagnosis in the family was a cousin on the maternal side. The patient does not know at what age she was diagnosed. There is no history of ovarian cancer in the family.   GYNECOLOGIC HISTORY: GX P0.  Menarche age 54, menopause 49 she took hormone replacement for about 10 years, discontinuing that in 2006.   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 Her husband Heather Patton is retired from the [employer]. The patient is a close friend of one of my patients, Heather Patton.    ADVANCED DIRECTIVES: in place  HEALTH MAINTENANCE:  (Updated January 2015) History  Substance Use Topics  . Smoking status: Former Smoker -- 0.50 packs/day    Quit date: 11/21/1978  . Smokeless tobacco: Never Used  . Alcohol Use: 3.6 oz/week    6 Glasses of wine per week     Colonoscopy: 2010/[facility]  PAP:UTD/[facility]  Bone density: Sept 2012/ [facility]  Lipid panel: UTD/Heather Patton  No Known Allergies  Current Outpatient Prescriptions  Medication Sig Dispense Refill  . calcium citrate-vitamin D (CITRACAL+D) 315-200 MG-UNIT per tablet Take 1 tablet by mouth daily. Calcium citrate $RemoveBeforeD'630mg'IXopDoecJMeAtC$     Vitamin D  500iu       . docusate sodium (COLACE) 100 MG capsule Take 100 mg by mouth daily.       Marland Kitchen ezetimibe-simvastatin (VYTORIN) 10-80 MG per tablet Take 1 tablet by mouth at bedtime.        Marland Kitchen letrozole (FEMARA) 2.5 MG tablet Take 1 tablet (2.5 mg total) by mouth daily.  90 tablet  3  . levothyroxine (SYNTHROID, LEVOTHROID) 150 MCG tablet Take 150 mcg by mouth daily.         No current facility-administered medications for this visit.    OBJECTIVE: Middle-aged white woman who appears well Filed Vitals:   12/16/13 1042  BP: 136/80  Pulse: 71  Temp: 98.3 F (36.8 C)  Resp: 18     Body mass index is 37.11 kg/(m^2).    ECOG FS: 0 Filed Weights   12/16/13 1042  Weight: 216 lb 4.8 oz (98.113 kg)   Physical Exam: HEENT:  Sclerae anicteric.  Oropharynx clear, pink, and moist. Neck supple with trachea midline.  NODES:  No cervical or supraclavicular lymphadenopathy palpated.  BREAST EXAM:  Right breast is unremarkable. Left breast is status post lumpectomy and radiation. There is some mild  tenderness to palpation, but no nodularities or skin changes, and no evidence of local recurrence. Axillae are benign bilaterally, no palpable lymphadenopathy. LUNGS:  Clear to auscultation bilaterally.  No wheezes or rhonchi HEART:  Regular rate and rhythm. No murmur  ABDOMEN:  Soft, obese, nontender.  Positive bowel sounds.  MSK:  No focal spinal tenderness to palpation. Good range of motion bilaterally in the upper extremities. EXTREMITIES:  Nonpitting pedal edema bilaterally, equal bilaterally. No upper extremity edema is noted. SKIN:  Benign with no rashes or skin changes. No nail dyscrasia. No ecchymoses or petechiae. No pallor. NEURO:  Nonfocal. Well oriented.  Positive affect.    LAB RESULTS: Lab Results  Component Value Date   WBC 6.7 12/16/2013   NEUTROABS 4.1 12/16/2013   HGB 14.8 12/16/2013  HCT 43.9 12/16/2013   MCV 86.8 12/16/2013   PLT 235 12/16/2013      Chemistry      Component Value Date/Time   NA 140 12/16/2013 1000   NA 140 11/02/2011 1219   K 4.2 12/16/2013 1000   K 3.7 11/02/2011 1219   CL 106 12/04/2012 1023   CL 104 11/02/2011 1219   CO2 25 12/16/2013 1000   CO2 26 11/02/2011 1219   BUN 15.7 12/16/2013 1000   BUN 13 11/02/2011 1219   CREATININE 0.8 12/16/2013 1000   CREATININE 0.69 11/02/2011 1219      Component Value Date/Time   CALCIUM 10.1 12/16/2013 1000   CALCIUM 9.9 11/02/2011 1219   ALKPHOS 82 12/16/2013 1000   ALKPHOS 92 11/02/2011 1219   AST 17 12/16/2013 1000   AST 20 11/02/2011 1219   ALT 20 12/16/2013 1000   ALT 23 11/02/2011 1219   BILITOT 0.64 12/16/2013 1000   BILITOT 0.4 11/02/2011 1219      STUDIES:  Most recent [facility] density at [facility] on 07/16/2013 was normal.   Mm Digital Diagnostic Bilat 11/28/2013   CLINICAL DATA:  History of left lumpectomy for breast cancer in 2013.  EXAM: DIGITAL DIAGNOSTIC bilateral MAMMOGRAM WITH 3D TOMO WITH CAD  Digital breast tomosynthesis images are acquired in two projections. These  images are reviewed in combination with the digital mammogram, confirming the findings below.  COMPARISON:  Prior exams  ACR Breast Density Category a: The breast tissue is almost entirely fatty.  FINDINGS: Left lumpectomy changes are reidentified, with evolution of rim calcifications and central lucency in the left upper outer quadrant, most compatible with fat necrosis. No new suspicious finding is seen on either side.  Mammographic images were processed with CAD.  IMPRESSION: No evidence for malignancy in either breast.  RECOMMENDATION: Diagnostic mammogram is suggested in 1 year. (Code:DM-B-01Y)  I have discussed the findings and recommendations with the patient. Results were also provided in writing at the conclusion of the visit. If applicable, a reminder letter will be sent to the patient regarding the next appointment.  BI-RADS CATEGORY  2: Benign Finding(s)   Electronically Signed   By: Conchita Paris M.D.   On: 11/28/2013 10:43     ASSESSMENT: 68 y.o. 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   (2)  She completed radiation April 2013  (3)  started letrozole, April 2013.   PLAN:  With regards to her breast cancer, Reann is doing extremely well with no clinical evidence of disease recurrence at this time. She will continue on letrozole as before, our plan being to continue for a total of 5 years until April 2018.  We will begin seeing her on an annual basis at this point, and we will alternate our appointments with Dr. Marlou Starks. Accordingly, she will see Dr. Marlou Starks this summer, and will return to see [place] again in January 2016 after her next annual mammogram. Her next bone density will be due in August of 2016.  All this was  reviewed in detail with Heather Patton today, who voices understanding and agreement with this plan. She will call any changes or problems prior to her next scheduled appointment.   Bayne Fosnaugh PA-C    12/16/2013

## 2014-02-12 ENCOUNTER — Ambulatory Visit (INDEPENDENT_AMBULATORY_CARE_PROVIDER_SITE_OTHER): Payer: Medicare Other

## 2014-02-12 DIAGNOSIS — R52 Pain, unspecified: Secondary | ICD-10-CM

## 2014-02-12 DIAGNOSIS — M779 Enthesopathy, unspecified: Secondary | ICD-10-CM

## 2014-02-12 DIAGNOSIS — Q66219 Congenital metatarsus primus varus, unspecified foot: Secondary | ICD-10-CM

## 2014-02-12 DIAGNOSIS — M2010 Hallux valgus (acquired), unspecified foot: Secondary | ICD-10-CM

## 2014-02-12 NOTE — Patient Instructions (Signed)
Bunion Care  A bunion is a boney protrusion at the base of your big toe (metatarsal-phalangeal joint). This problem, if painful or troublesome can be corrected with surgery. This is an elective surgery, so you can pick a convenient time for the procedure. The surgery may:  · Improve appearance (cosmetic).  · Relieve pain.  · Improve function.  Your foot is made up of a complex set of twenty-six bones which are held together by tough fibrous ligaments. The movement of the foot is controlled by muscles in the foot and leg. These muscles attach to the foot by cord like structures (tendons) that attach muscle to bone.  If surgery is recommended, your caregiver will explain your foot problem and how surgery can improve it. Your caregiver can answer questions you may have about the potential risks and complications involved. After determining that foot surgery is necessary to correct your problem, you can proceed with plans for the surgery.  LET YOUR CAREGIVER KNOW ABOUT:   · Previous problems with anesthetics or medicines used to numb the skin.  · Allergies to dyes, iodine, foods, and/or latex.  · Medicines taken including herbs, eye drops, prescription medicines (especially medicines used to "thin the blood"), aspirin and other over-the-counter medicines, and steroids (by mouth or as a cream).  · History of bleeding or blood problems.  · Possibility of pregnancy, if this applies.  · History of blood clots in your legs and/or lungs.  · Previous surgery.  · Other important health problems.  Let your caregiver know about health changes prior to surgery.  BEFORE THE PROCEDURE   You should be present 60 minutes prior to your procedure or as directed.   PROCEDURE  BUNION TYPES AND THEIR TREATMENTS  · Positional Bunion. A positional bunion develops when a bony growth on the side of the metatarsal bone enlarges the joint. The metatarsal forces the joint capsule to stretch over it. As this growth pushes the big toe toward the  others, the tendons on the inside tighten. This forces the big toe farther out of alignment. The bunion presses against the shoe, irritating the skin and causes further pain and disability.  ¨ Positional Bunionectomy Treatment. The bunion is removed. Tight tendons may be released.  ¨ Follow-up Care. Your toe is apt to be stiff at first but will loosen up as you move it. You may need to wear a special surgical shoe and, possibly, a splint for about three weeks.  · Mild Structural Bunion. Structural bunions occur when the angle between the first and second metatarsal bones increases to a point where it is greater than normal. The increased angle of the metatarsals makes the big toe slant toward the other toes. Sometimes bony growths may form. Irritation and swelling often follow.  ¨ Structural Bunionectomy Treatment. Your caregiver surgically repositions the bone by decreasing the angle and may use a fixation device to hold it together. The bunion (bump) is also removed.  · Degenerative Joint Disease (Arthritis). When wear-and-tear arthritis (osteoarthritis) of aging affects the big toe joint, pain and reduced joint motion may result. This is not a true bunion but may be associated with bunions. Left untreated, it can increase wear and tear in the joint and break down the cartilage. Pain and stiffness are problems of both wear-and-tear arthritis and rheumatoid arthritis.  · Arthroplasty With Joint Implantation As Treatment. The bunion is first removed; then the degenerated joint is removed and replaced with an implant.  AFTER THE PROCEDURE     is back to normal strength along with a return of nearly normal function. It is best to do elective surgeries when your health is optimal.  After surgery, you will be taken to the recovery area where a nurse will watch and  check your progress. Once you are awake, stable, and taking fluids well, barring other problems you will be allowed to go home. HOME CARE INSTRUCTIONS  Be sure to ask your caregiver how long you will be off your feet and home from work. Plan accordingly. There are several types of bunions and varying surgical treatments for each. Common types are explained above. Your surgery may be similar and may include a fixation device (such as a small screw). Your foot and ankle may be immobilized by a cast (from your toes to below your knee). You may be asked not to bear weight on this foot for a few weeks or until comfortable. Once home, an ice pack applied to your operative site may help with discomfort and keep the swelling down. You may be able to walk a day or two after surgery. Your podiatrist may prescribe a splint or a special shoe to be worn for several weeks. Only take over-the-counter or prescription medicines for pain, discomfort, or fever as directed by your caregiver.  SEEK MEDICAL CARE IF:   There is increased bleeding (more than a small spot) from the surgical site.  You notice redness, swelling, or increasing pain in the surgical site.  Pus is coming from the site.  An unexplained oral temperature above 102 F (38.9 C) develops.  You notice a foul smell coming from the surgical site or dressing. SEEK IMMEDIATE MEDICAL CARE IF:  You develop a rash, have difficulty breathing, or have any allergic problems with medications. Document Released: 11/04/2000 Document Revised: 01/30/2012 Document Reviewed: 11/10/2008 [facility] Patient Information 2014 [place], [place].

## 2014-02-12 NOTE — Progress Notes (Signed)
   Subjective:    Patient ID: Heather Patton, female    DOB: 02-May-1946, 68 y.o.   MRN: [id number]  HPI PT STATED BOTH FEET HAVE BUNION AND THEY BEEN HURTING 30 YEARS. THE FEET GETTING WORSE ESPECIALLY THE RT FOOT. THE FEET GET AGGRAVATED BY WALKING AND SHOES NOT WIDE ENOUGH. HAVE SURGERY BEFORE ON THE LT FOOT BUT NOT THE RT FOOT.    Review of Systems  Constitutional: Negative.   HENT: Negative.   Cardiovascular: Negative.   Gastrointestinal: Negative.   Musculoskeletal: Negative.   Skin: Negative.   Neurological: Negative.   Hematological: Negative.   Psychiatric/Behavioral: Negative.   All other systems reviewed and are negative.       Objective:   Physical Exam Bursae objective findings as follows neurovascular status is intact with pedal pulses palpable DP and PT +24 Refill time 3 seconds all digits skin temperature warm turgor normal no edema rubor pallor or varicosities neurologic epicritic and proprioceptive sensations intact and symmetric is normal plantar response DTRs not listed neurologically skin color pigment and hair growth well-healed incision scar bunionectomy 30 years ago left first MTP area. Orthopedic biomechanical exam reveals significant bunion deformity lateral deviation of the hallux there is pain both on range of motion palpation of the first MTP area dorsiflexion plantarflexion of the hallux right more so than left. X-rays demonstrate IM angles 16 on the right 70 and 18 left sesamoid position 7 bilateral significant mild adductus is also identified as well as met primus varus or several ability of the first ray noted at the first metatarsal base patient also has a cavus type foot with possibly some pain tenderness over the mid tarsus or tarsal metatarsal area right foot more so than left there may be some tarsal metatarsal spurring or saddle bone type deformity.       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 Capsulitis first MTP area bilateral with mild arthropathy. Discussed bunion surgery likely need Lapidus bunionectomy would be necessary for correction of her deformity based on severity patient can be nonweightbearing for 6-8 weeks postoperatively she is a caregiver for her husband is not necessary ready for surgery be nonweightbearing at this time suggest using a walker or rollabout be possible patient will take a literature which was dispensed about surgery and discuss with family and spouse and make recommendations and considerations for possible future surgery at her convenience. All questions asked by the patient are answered at this time in the interim maintain accommodative shoes recommend things like Birkenstocks and crocs also suggested over-the-counter NSAID if needed for pain encouraged have it corrected as the joints are starting to get degenerative with some crepitus on range of motion noted oriented right as opposed left.  Harriet Masson DPM

## 2014-03-10 ENCOUNTER — Ambulatory Visit (INDEPENDENT_AMBULATORY_CARE_PROVIDER_SITE_OTHER): Payer: Medicare Other | Admitting: General Surgery

## 2014-03-10 ENCOUNTER — Encounter (INDEPENDENT_AMBULATORY_CARE_PROVIDER_SITE_OTHER): Payer: Self-pay | Admitting: General Surgery

## 2014-03-10 VITALS — BP 104/78 | HR 76 | Temp 97.0°F | Resp 14 | Ht 64.0 in | Wt 214.8 lb

## 2014-03-10 DIAGNOSIS — C50919 Malignant neoplasm of unspecified site of unspecified female breast: Secondary | ICD-10-CM

## 2014-03-10 DIAGNOSIS — C50912 Malignant neoplasm of unspecified site of left female breast: Secondary | ICD-10-CM

## 2014-03-10 NOTE — Progress Notes (Signed)
Subjective:     Patient ID: Heather Patton, female   DOB: 06-29-1946, 68 y.o.   MRN: [id number]  HPI The patient is a 68 her white female who is 2 years in 3 months status post left breast lumpectomy and negative sentinel node biopsy for a T1 B. N0 left breast cancer. She finished radiation therapy and is now taking letrozole. She is tolerating this well. She does have some joint aches in her hands but this is not significantly changed from before treatment. She denies any hot flashes. Her last mammogram was in January that showed no evidence of malignancy. She continues to have some soreness of the left breast.  Review of Systems  Constitutional: Negative.   HENT: Negative.   Eyes: Negative.   Respiratory: Negative.   Cardiovascular: Negative.   Gastrointestinal: Negative.   Endocrine: Negative.   Genitourinary: Negative.   Musculoskeletal: Negative.   Skin: Negative.   Allergic/Immunologic: Negative.   Neurological: Negative.   Hematological: Negative.   Psychiatric/Behavioral: Negative.        Objective:   Physical Exam  Constitutional: She is oriented to person, place, and time. She appears well-developed and well-nourished.  HENT:  Head: Normocephalic and atraumatic.  Eyes: Conjunctivae and EOM are normal. Pupils are equal, round, and reactive to light.  Neck: Normal range of motion. Neck supple.  Cardiovascular: Normal rate, regular rhythm and normal heart sounds.   Pulmonary/Chest: Effort normal and breath sounds normal.  There is some fullness beneath the scar in the inner left breast secondary to radiation. Otherwise there is no other palpable mass in either breast. There is no palpable axillary, supraclavicular, or cervical lymphadenopathy  Abdominal: Soft. Bowel sounds are normal.  Musculoskeletal: Normal range of motion.  Lymphadenopathy:    She has no cervical adenopathy.  Neurological: She is alert and oriented to person, place, and time.  Skin: Skin is warm and  dry.  Psychiatric: She has a normal mood and affect. Her behavior is normal.       Assessment:     The patient is 2 years in 3 months status post left breast lumpectomy for breast cancer     Plan:     At this point she will continue to take letrozole. She will continue to do regular self exams. I will plan to see her back in about 6 months.

## 2014-03-10 NOTE — Patient Instructions (Signed)
Continue letrozole Continue regular self exams

## 2014-03-14 NOTE — Telephone Encounter (Signed)
Please see Visit Info comments 

## 2014-03-28 ENCOUNTER — Other Ambulatory Visit: Payer: Self-pay | Admitting: Gastroenterology

## 2014-08-08 ENCOUNTER — Other Ambulatory Visit: Payer: Self-pay | Admitting: Oncology

## 2014-08-08 DIAGNOSIS — C50919 Malignant neoplasm of unspecified site of unspecified female breast: Secondary | ICD-10-CM

## 2014-10-29 ENCOUNTER — Other Ambulatory Visit (INDEPENDENT_AMBULATORY_CARE_PROVIDER_SITE_OTHER): Payer: Self-pay | Admitting: General Surgery

## 2014-10-29 ENCOUNTER — Other Ambulatory Visit: Payer: Self-pay | Admitting: Oncology

## 2014-10-29 DIAGNOSIS — Z853 Personal history of malignant neoplasm of breast: Secondary | ICD-10-CM

## 2014-11-17 ENCOUNTER — Telehealth: Payer: Self-pay | Admitting: Oncology

## 2014-11-17 NOTE — Telephone Encounter (Signed)
Lvm advising appt chg from 1/25 (md pal) to 2/8 @ 1.45pm. Also mailed appt calendar.

## 2014-12-01 ENCOUNTER — Ambulatory Visit
Admission: RE | Admit: 2014-12-01 | Discharge: 2014-12-01 | Disposition: A | Discharge status: Home / Self Care | Payer: Medicare Other | Source: Ambulatory Visit | Attending: Oncology | Admitting: Oncology

## 2014-12-01 ENCOUNTER — Encounter (INDEPENDENT_AMBULATORY_CARE_PROVIDER_SITE_OTHER): Payer: Self-pay

## 2014-12-01 DIAGNOSIS — Z853 Personal history of malignant neoplasm of breast: Secondary | ICD-10-CM

## 2014-12-15 ENCOUNTER — Other Ambulatory Visit: Payer: Medicare Other

## 2014-12-15 ENCOUNTER — Ambulatory Visit: Payer: Medicare Other | Admitting: Oncology

## 2014-12-26 ENCOUNTER — Other Ambulatory Visit: Payer: Self-pay | Admitting: *Deleted

## 2014-12-26 DIAGNOSIS — C50319 Malignant neoplasm of lower-inner quadrant of unspecified female breast: Secondary | ICD-10-CM

## 2014-12-29 ENCOUNTER — Telehealth: Payer: Self-pay | Admitting: Oncology

## 2014-12-29 ENCOUNTER — Other Ambulatory Visit (HOSPITAL_BASED_OUTPATIENT_CLINIC_OR_DEPARTMENT_OTHER): Payer: Medicare Other

## 2014-12-29 ENCOUNTER — Other Ambulatory Visit: Payer: Self-pay | Admitting: *Deleted

## 2014-12-29 ENCOUNTER — Ambulatory Visit (HOSPITAL_BASED_OUTPATIENT_CLINIC_OR_DEPARTMENT_OTHER): Payer: Medicare Other | Admitting: Nurse Practitioner

## 2014-12-29 ENCOUNTER — Encounter: Payer: Self-pay | Admitting: Nurse Practitioner

## 2014-12-29 VITALS — BP 138/67 | HR 88 | Temp 97.5°F | Resp 19 | Ht 64.0 in | Wt 223.7 lb

## 2014-12-29 DIAGNOSIS — C50319 Malignant neoplasm of lower-inner quadrant of unspecified female breast: Secondary | ICD-10-CM

## 2014-12-29 DIAGNOSIS — C50312 Malignant neoplasm of lower-inner quadrant of left female breast: Secondary | ICD-10-CM

## 2014-12-29 LAB — CBC WITH DIFFERENTIAL/PLATELET
BASO%: 1.1 % (ref 0.0–2.0)
Basophils Absolute: 0.1 10*3/uL (ref 0.0–0.1)
EOS%: 3.5 % (ref 0.0–7.0)
Eosinophils Absolute: 0.3 10*3/uL (ref 0.0–0.5)
HEMATOCRIT: 44 % (ref 34.8–46.6)
HGB: 14 g/dL (ref 11.6–15.9)
LYMPH#: 2.1 10*3/uL (ref 0.9–3.3)
LYMPH%: 28.8 % (ref 14.0–49.7)
MCH: 27.8 pg (ref 25.1–34.0)
MCHC: 31.9 g/dL (ref 31.5–36.0)
MCV: 87.2 fL (ref 79.5–101.0)
MONO#: 0.5 10*3/uL (ref 0.1–0.9)
MONO%: 7 % (ref 0.0–14.0)
NEUT%: 59.6 % (ref 38.4–76.8)
NEUTROS ABS: 4.3 10*3/uL (ref 1.5–6.5)
Platelets: 249 10*3/uL (ref 145–400)
RBC: 5.05 10*6/uL (ref 3.70–5.45)
RDW: 13.1 % (ref 11.2–14.5)
WBC: 7.2 10*3/uL (ref 3.9–10.3)

## 2014-12-29 LAB — COMPREHENSIVE METABOLIC PANEL (CC13)
ALBUMIN: 4 g/dL (ref 3.5–5.0)
ALK PHOS: 77 U/L (ref 40–150)
ALT: 19 U/L (ref 0–55)
AST: 18 U/L (ref 5–34)
Anion Gap: 10 mEq/L (ref 3–11)
BUN: 15.7 mg/dL (ref 7.0–26.0)
CHLORIDE: 106 meq/L (ref 98–109)
CO2: 25 mEq/L (ref 22–29)
Calcium: 9.2 mg/dL (ref 8.4–10.4)
Creatinine: 1 mg/dL (ref 0.6–1.1)
EGFR: 59 mL/min/{1.73_m2} — AB (ref >90)
Glucose: 101 mg/dl (ref 70–140)
Potassium: 4.3 mEq/L (ref 3.5–5.1)
SODIUM: 142 meq/L (ref 136–145)
Total Bilirubin: 0.32 mg/dL (ref 0.20–1.20)
Total Protein: 6.8 g/dL (ref 6.4–8.3)

## 2014-12-29 MED ORDER — LETROZOLE 2.5 MG PO TABS: 2.5000 mg | ORAL_TABLET | Freq: Every day | ORAL | Status: DC

## 2014-12-29 NOTE — Telephone Encounter (Signed)
, °

## 2014-12-29 NOTE — Progress Notes (Signed)
ID: Heather Patton   DOB: 10-31-1946  MR#: [id number]  CBU#:[id number]  PCP: Sheela Stack, MD GYN: SUStar Age, MD OTHER MD: Gery Pray, MD;  Clarene Essex, MD  CHIEF COMPLAINT:  Hx of Left Breast Cancer CURRENT TREATMENT: letrozole daily  BREAST CANCER HISTORY: The patient had routine screening mammography at the breast center 09/21/2011. A possible mass was noted in the left breast, and she was recalled for additional views November 21, 29012. Diagnostic mammography showed a low density spiculated mass in the lower outer quadrant of the left breast measuring 6 mm. This was not palpable to the mammographer. Ultrasound showed only normal tissue in the area in question.  Stereotactic biopsy was performed 10/21/2011 and showed [phone] an invasive carcinoma with lobular features grade 1 or 2, with no her 2 amplification, 99% estrogen receptor positive, 94% progesterone receptor positive, with an MIB-1-1 of 15%.  With this information the patient underwent bilateral breast MRI 10/28/2011. A post biopsy hematoma was noted in the lower inner aspect of the left breast, with a small area of masslike enhancement associated with it. This area measured 7 mm. The MRI showed a second irregularly marginated mass in the central portion of the left breast 2.6 cm away from the biopsied mass. The second mass measured 5 mm maximally. There was no abnormality in the right breast and no adenopathy noted.  The patient was evaluated at the multidisciplinary breast clinic on 11/02/2011 and underwent definitive surgery 12/05/2011. Her subsequent history is as detailed below.  INTERVAL HISTORY: Heather Patton returns for routine follow up of her left breast cancer. She has been on letrozole since April 2013 and is tolerating it pretty well. She denies hot flashes or vaginal changes, but does have some arthritic pain to her knees and wrists. She does not take anything for this discomfort, it simply gets better with  movement. She works out a few days a week at [employer] for exercise. The interval history is remarkable for right foot surgery last year where she had a hammertoe and a bunion corrected. There is still swelling to this area, but she was told that this is normal for the first 6 months after surgery. The interval history is otherwise unremarkable.    REVIEW OF SYSTEMS: A detailed review of systems is otherwise completely negative.   PAST MEDICAL HISTORY: Past Medical History  Diagnosis Date  . Degenerative joint disease   . GERD (gastroesophageal reflux disease)   . Osteopenia   . Sleep apnea   . Hx of colonic polyps   . Hypothyroidism   . Hyperlipidemia   . Radiation 02/28/12  6300 cGy    left breast    PAST SURGICAL HISTORY: Past Surgical History  Procedure Laterality Date  . Tonsillectomy    . Abdominal hysterectomy    . Cervical disc surgery    . Bunionectomy  1985    lt  . Colonoscopy    . Breast biopsy  12/05/2011    left breast    FAMILY HISTORY Family History  Problem Relation Age of Onset  . Cancer Mother     breast  . Cancer Cousin     breast  . Heart disease Father   the patient's father died at the age of 74 from a myocardial infarction. The patient's mother was diagnosed with breast cancer at age 1, but she died at age 31 following a fall. The patient had no brothers, has one sister. The only other breast cancer diagnosis in the  family was a cousin on the maternal side. The patient does not know at what age she was diagnosed. There is no history of ovarian cancer in the family.   GYNECOLOGIC HISTORY: GX P0. Menarche age 17, menopause 22 she took hormone replacement for about 10 years, discontinuing that in 2006.   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 Her husband [name] is retired from the [employer]. The patient is a close friend of one of my patients, Deanza Upperman.    ADVANCED DIRECTIVES: in place  HEALTH  MAINTENANCE:  (Updated January 2015) History  Substance Use Topics  . Smoking status: Former Smoker -- 0.50 packs/day    Quit date: 11/21/1978  . Smokeless tobacco: Never Used  . Alcohol Use: 3.6 oz/week    6 Glasses of wine per week     Colonoscopy: 2010/[facility]  PAP:UTD/[facility]  Bone density: Sept 2012/ [facility]  Lipid panel: UTD/[facility]  No Known Allergies  Current Outpatient Prescriptions  Medication Sig Dispense Refill  . aspirin EC 81 MG tablet Take 81 mg by mouth daily.    . calcium citrate-vitamin D (CITRACAL+D) 315-200 MG-UNIT per tablet Take 1 tablet by mouth daily. Calcium citrate 630mg     Vitamin D  500iu     . docusate sodium (COLACE) 100 MG capsule Take 100 mg by mouth daily.     Marland Kitchen ezetimibe-simvastatin (VYTORIN) 10-80 MG per tablet Take 1 tablet by mouth at bedtime.      Marland Kitchen levothyroxine (SYNTHROID, LEVOTHROID) 150 MCG tablet Take 150 mcg by mouth daily.      Marland Kitchen letrozole (FEMARA) 2.5 MG tablet Take 1 tablet (2.5 mg total) by mouth daily. 90 tablet 3   No current facility-administered medications for this visit.    OBJECTIVE: Middle-aged white woman who appears well Filed Vitals:   12/29/14 1351  BP: 138/67  Pulse: 88  Temp: 97.5 F (36.4 C)  Resp: 19     Body mass index is 38.38 kg/(m^2).    ECOG FS: 0 Filed Weights   12/29/14 1351  Weight: 223 lb 11.2 oz (101.47 kg)   Physical Exam:  Skin: warm, dry  HEENT: sclerae anicteric, conjunctivae pink, oropharynx clear. No thrush or mucositis.  Lymph Nodes: No cervical or supraclavicular lymphadenopathy  Lungs: clear to auscultation bilaterally, no rales, wheezes, or rhonci  Heart: regular rate and rhythm  Abdomen: round, soft, non tender, positive bowel sounds  Musculoskeletal: No focal spinal tenderness, no peripheral edema  Neuro: non focal, well oriented, positive affect  Breasts: left breast status post lumpectomy and radiation. Hyperpigmentation noted to the skin. Mild tenderness with palpation. No evidence of  recurrent disease. Bilateral axillae benign. Right breast unremarkable.    LAB RESULTS: Lab Results  Component Value Date   WBC 7.2 12/29/2014   NEUTROABS 4.3 12/29/2014   HGB 14.0 12/29/2014   HCT 44.0 12/29/2014   MCV 87.2 12/29/2014   PLT 249 12/29/2014      Chemistry      Component Value Date/Time   NA 142 12/29/2014 1341   NA 140 11/02/2011 1219   K 4.3 12/29/2014 1341   K 3.7 11/02/2011 1219   CL 106 12/04/2012 1023   CL 104 11/02/2011 1219   CO2 25 12/29/2014 1341   CO2 26 11/02/2011 1219   BUN 15.7 12/29/2014 1341   BUN 13 11/02/2011 1219   CREATININE 1.0 12/29/2014 1341   CREATININE 0.69 11/02/2011 1219      Component Value Date/Time  CALCIUM 9.2 12/29/2014 1341   CALCIUM 9.9 11/02/2011 1219   ALKPHOS 77 12/29/2014 1341   ALKPHOS 92 11/02/2011 1219   AST 18 12/29/2014 1341   AST 20 11/02/2011 1219   ALT 19 12/29/2014 1341   ALT 23 11/02/2011 1219   BILITOT 0.32 12/29/2014 1341   BILITOT 0.4 11/02/2011 1219      STUDIES: Most recent bone density at [facility] on 07/16/2013 was normal.  Mm Digital Diagnostic Bilat  12/01/2014   CLINICAL DATA:  Personal history of left breast cancer status post lumpectomy 2013.  EXAM: DIGITAL DIAGNOSTIC  BILATERAL MAMMOGRAM WITH CAD  COMPARISON:  November 28, 2013, November 26, 2012, September 21, 2011  ACR Breast Density Category b: There are scattered areas of fibroglandular density.  FINDINGS: Cc and MLO views of bilateral breasts, spot tangential views of left breast are submitted. Stable postsurgical changes are identified within the left breast. No suspicious abnormality is identified bilaterally.  Mammographic images were processed with CAD.  IMPRESSION: Benign findings.  RECOMMENDATION: Bilateral diagnostic mammogram in 1 year.  I have discussed the findings and recommendations with the patient. Results were also provided in writing at the conclusion of the visit. If applicable, a reminder letter will be sent  to the patient regarding the next appointment.  BI-RADS CATEGORY  2: Benign Finding(s)   Electronically Signed   By: Abelardo Diesel M.D.   On: 12/01/2014 10:43    ASSESSMENT: 69 y.o. 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   (2)  She completed radiation April 2013  (3)  started letrozole, April 2013.   PLAN:  Caryl continues to do well as far as her breast cancer is concerned. She is now 3 years out from her definitive surgery with no evidence of recurrent disease. She is tolerating the letrozole well with few complaints. She will continue this drug until April 2018 for 5 total years of antiestrogen therapy.   Jenella will return for labs and follow up visit in 1 year. She understands and agrees with this plan. She knows the goal of treatment in her case is cure. She has been encouraged to call with any issues that might arise before her next visit here.  Marcelino Duster      12/29/2014

## 2015-10-26 ENCOUNTER — Other Ambulatory Visit: Payer: Self-pay | Admitting: Endocrinology

## 2015-10-26 ENCOUNTER — Other Ambulatory Visit: Payer: Self-pay

## 2015-10-26 DIAGNOSIS — Z853 Personal history of malignant neoplasm of breast: Secondary | ICD-10-CM

## 2015-12-04 ENCOUNTER — Ambulatory Visit
Admission: RE | Admit: 2015-12-04 | Discharge: 2015-12-04 | Disposition: A | Discharge status: Home / Self Care | Payer: Medicare Other | Source: Ambulatory Visit | Attending: Endocrinology | Admitting: Endocrinology

## 2015-12-04 DIAGNOSIS — Z853 Personal history of malignant neoplasm of breast: Secondary | ICD-10-CM

## 2015-12-30 ENCOUNTER — Other Ambulatory Visit: Payer: Self-pay | Admitting: *Deleted

## 2015-12-30 DIAGNOSIS — C50312 Malignant neoplasm of lower-inner quadrant of left female breast: Secondary | ICD-10-CM

## 2015-12-31 ENCOUNTER — Telehealth: Payer: Self-pay | Admitting: Oncology

## 2015-12-31 ENCOUNTER — Ambulatory Visit (HOSPITAL_BASED_OUTPATIENT_CLINIC_OR_DEPARTMENT_OTHER): Payer: Medicare Other | Admitting: Oncology

## 2015-12-31 ENCOUNTER — Other Ambulatory Visit (HOSPITAL_BASED_OUTPATIENT_CLINIC_OR_DEPARTMENT_OTHER): Payer: Medicare Other

## 2015-12-31 VITALS — BP 131/71 | HR 73 | Temp 97.5°F | Resp 18 | Ht 64.0 in | Wt 227.9 lb

## 2015-12-31 DIAGNOSIS — Z853 Personal history of malignant neoplasm of breast: Secondary | ICD-10-CM

## 2015-12-31 DIAGNOSIS — E669 Obesity, unspecified: Secondary | ICD-10-CM | POA: Diagnosis not present

## 2015-12-31 DIAGNOSIS — C50312 Malignant neoplasm of lower-inner quadrant of left female breast: Secondary | ICD-10-CM

## 2015-12-31 LAB — COMPREHENSIVE METABOLIC PANEL
ALK PHOS: 66 U/L (ref 40–150)
ALT: 30 U/L (ref 0–55)
ANION GAP: 10 meq/L (ref 3–11)
AST: 24 U/L (ref 5–34)
Albumin: 4 g/dL (ref 3.5–5.0)
BILIRUBIN TOTAL: 0.6 mg/dL (ref 0.20–1.20)
BUN: 18 mg/dL (ref 7.0–26.0)
CALCIUM: 9.3 mg/dL (ref 8.4–10.4)
CO2: 23 mEq/L (ref 22–29)
Chloride: 108 mEq/L (ref 98–109)
Creatinine: 0.8 mg/dL (ref 0.6–1.1)
EGFR: 78 mL/min/{1.73_m2} — ABNORMAL LOW (ref >90)
GLUCOSE: 103 mg/dL (ref 70–140)
POTASSIUM: 4.5 meq/L (ref 3.5–5.1)
Sodium: 140 mEq/L (ref 136–145)
Total Protein: 7.1 g/dL (ref 6.4–8.3)

## 2015-12-31 LAB — CBC WITH DIFFERENTIAL/PLATELET
BASO%: 0.7 % (ref 0.0–2.0)
Basophils Absolute: 0 10*3/uL (ref 0.0–0.1)
EOS%: 3.4 % (ref 0.0–7.0)
Eosinophils Absolute: 0.2 10*3/uL (ref 0.0–0.5)
HEMATOCRIT: 43.4 % (ref 34.8–46.6)
HEMOGLOBIN: 14.6 g/dL (ref 11.6–15.9)
LYMPH#: 1.7 10*3/uL (ref 0.9–3.3)
LYMPH%: 28.4 % (ref 14.0–49.7)
MCH: 29 pg (ref 25.1–34.0)
MCHC: 33.5 g/dL (ref 31.5–36.0)
MCV: 86.5 fL (ref 79.5–101.0)
MONO#: 0.5 10*3/uL (ref 0.1–0.9)
MONO%: 8.2 % (ref 0.0–14.0)
NEUT#: 3.6 10*3/uL (ref 1.5–6.5)
NEUT%: 59.3 % (ref 38.4–76.8)
Platelets: 211 10*3/uL (ref 145–400)
RBC: 5.02 10*6/uL (ref 3.70–5.45)
RDW: 12.7 % (ref 11.2–14.5)
WBC: 6 10*3/uL (ref 3.9–10.3)

## 2015-12-31 NOTE — Progress Notes (Signed)
ID: Heather Patton   DOB: 12/25/1945  MR#: [id number]  UXN#:[id number]  PCP: Heather Stack, MD GYN: SUStar Age, MD OTHER MD: Heather Pray, MD;  Heather Essex, MD  CHIEF COMPLAINT:  Estrogen receptor positiveLeft Breast Cancer  CURRENT TREATMENT: letrozole   BREAST CANCER HISTORY: From the original intake note : The patient had routine screening mammography at the breast center 09/21/2011. A possible mass was noted in the left breast, and she was recalled for additional views November 21, 29012. Diagnostic mammography showed a low density spiculated mass in the lower outer quadrant of the left breast measuring 6 mm. This was not palpable to the mammographer. Ultrasound showed only normal tissue in the area in question.  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  With this information the patient underwent bilateral breast MRI 10/28/2011. A post biopsy hematoma was noted in the lower inner aspect of the left breast, with a small area of masslike enhancement associated with it. This area measured 7 mm. The MRI showed a second irregularly marginated mass in the central portion of the left breast 2.6 cm away from the biopsied mass. The second mass measured 5 mm maximally. There was no abnormality in the right breast and no adenopathy noted.  The patient was evaluated at the multidisciplinary breast clinic on 11/02/2011 and underwent definitive surgery 12/05/2011.   Her subsequent history is as detailed below.  INTERVAL HISTORY: Heather Patton returns for Follow-up of her estrogen receptor positive breast cancer. She continues on letrozole, with excellent tolerance. Hot flashes and vaginal dryness are not major concerns. She does have some joint pain here and there which might be related to the drug,  but on the other hand it would be hard to distinguish from mild arthritis. This is not more intense or persistent than before. She obtains letrozole at $12 per month.   REVIEW OF SYSTEMS: She exercises at curves about 3 times per week. A detailed review of systems today is otherwise entirely negative.  PAST MEDICAL HISTORY: Past Medical History  Diagnosis Date  . Degenerative joint disease   . GERD (gastroesophageal reflux disease)   . Osteopenia   . Sleep apnea   . Hx of colonic polyps   . Hypothyroidism   . Hyperlipidemia   . Radiation 02/28/12  6300 cGy    left breast    PAST SURGICAL HISTORY: Past Surgical History  Procedure Laterality Date  . Tonsillectomy    . Abdominal hysterectomy    . Cervical disc surgery    . Bunionectomy  1985    lt  . Colonoscopy    . Breast biopsy  12/05/2011    left breast    FAMILY HISTORY Family History  Problem Relation Age of Onset  . Cancer Mother     breast  . Cancer Cousin     breast  . Heart disease Father   the patient's father died at the age of 93 from a myocardial infarction. The patient's mother was diagnosed with breast cancer at age 27, but she died at age 28 following a fall. The patient had no brothers, has one sister. The only other breast cancer diagnosis in the family was a cousin on the maternal side. The patient does not know at what age she was diagnosed. There is no history of ovarian cancer in  the family.   GYNECOLOGIC HISTORY: GX P0. Menarche age 47, menopause 53 she took hormone replacement for about 10 years, discontinuing that in 2006.   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 Her husband Heather Patton is retired from the [employer]. The patient is a friend of one of my patients, Heather Patton.    ADVANCED DIRECTIVES: in place  HEALTH MAINTENANCE:  (Updated January 2015) Social History  Substance Use Topics  . Smoking status: Former Smoker -- 0.50 packs/day    Quit  date: 11/21/1978  . Smokeless tobacco: Never Used  . Alcohol Use: 3.6 oz/week    6 Glasses of wine per week     Colonoscopy: 2010/[facility]  PAP:UTD/[facility]  Bone density: Sept 2012/ [facility]  Lipid panel: UTD/[facility]  No Known Allergies  Current Outpatient Prescriptions  Medication Sig Dispense Refill  . aspirin EC 81 MG tablet Take 81 mg by mouth daily.    . calcium citrate-vitamin D (CITRACAL+D) 315-200 MG-UNIT per tablet Take 1 tablet by mouth daily. Calcium citrate 619m    Vitamin D  500iu     . docusate sodium (COLACE) 100 MG capsule Take 100 mg by mouth daily.     .Marland Kitchenezetimibe-simvastatin (VYTORIN) 10-80 MG per tablet Take 1 tablet by mouth at bedtime.      .Marland Kitchenletrozole (FEMARA) 2.5 MG tablet Take 1 tablet (2.5 mg total) by mouth daily. 90 tablet 3  . levothyroxine (SYNTHROID, LEVOTHROID) 150 MCG tablet Take 150 mcg by mouth daily.       No current facility-administered medications for this visit.    OBJECTIVE: Middle-aged white woman in no acute distress Filed Vitals:   12/31/15 1103  BP: 131/71  Pulse: 73  Temp: 97.5 F (36.4 C)  Resp: 18     Body mass index is 39.1 kg/(m^2).    ECOG FS: 0 Filed Weights   12/31/15 1103  Weight: 227 lb 14.4 oz (103.375 kg)   Sclerae unicteric, pupils round and equal Oropharynx clear and moist-- no thrush or other lesions No cervical or supraclavicular adenopathy Lungs no rales or rhonchi Heart regular rate and rhythm Abd soft, obese, nontender, positive bowel sounds MSK no focal spinal tenderness, no upper extremity lymphedema Neuro: nonfocal, well oriented, appropriate affect Breasts: the right breast is unremarkable. The left breast is status post lumpectomy and radiation. The cosmetic result is excellent. There is no evidence of local recurrence. The left axilla is benign.  LAB RESULTS: Lab Results  Component Value Date   WBC 6.0 12/31/2015   NEUTROABS 3.6 12/31/2015   HGB 14.6 12/31/2015   HCT 43.4 12/31/2015   MCV 86.5  12/31/2015   PLT 211 12/31/2015      Chemistry      Component Value Date/Time   NA 140 12/31/2015 1026   NA 140 11/02/2011 1219   K 4.5 12/31/2015 1026   K 3.7 11/02/2011 1219   CL 106 12/04/2012 1023   CL 104 11/02/2011 1219   CO2 23 12/31/2015 1026   CO2 26 11/02/2011 1219   BUN 18.0 12/31/2015 1026   BUN 13 11/02/2011 1219   CREATININE 0.8 12/31/2015 1026   CREATININE 0.69 11/02/2011 1219      Component Value Date/Time   CALCIUM 9.3 12/31/2015 1026   CALCIUM 9.9 11/02/2011 1219   ALKPHOS 66 12/31/2015 1026   ALKPHOS 92 11/02/2011 1219   AST 24 12/31/2015 1026   AST 20 11/02/2011 1219   ALT 30 12/31/2015 1026  ALT 23 11/02/2011 1219   BILITOT 0.60 12/31/2015 1026   BILITOT 0.4 11/02/2011 1219      STUDIES: Mm Diag Breast Tomo Bilateral  12/04/2015  CLINICAL DATA:  Patient status post left breast lumpectomy 2013. EXAM: DIGITAL DIAGNOSTIC BILATERAL MAMMOGRAM WITH 3D TOMOSYNTHESIS AND CAD COMPARISON:  Previous exam(s). ACR Breast Density Category b: There are scattered areas of fibroglandular density. FINDINGS: Stable postlumpectomy changes left breast. No concerning masses, calcifications or nonsurgical distortion identified within either breast. Mammographic images were processed with CAD. IMPRESSION: No mammographic evidence malignancy. Stable postlumpectomy changes left breast. RECOMMENDATION: Bilateral diagnostic mammography in 1 year. I have discussed the findings and recommendations with the patient. Results were also provided in writing at the conclusion of the visit. If applicable, a reminder letter will be sent to the patient regarding the next appointment. BI-RADS CATEGORY  2: Benign. Electronically Signed   By: Lovey Newcomer M.D.   On: 12/04/2015 14:16    ASSESSMENT: 70 y.o. 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   (2)  She completed radiation April 2013  (3)  started letrozole, April 2013.   PLAN:  Ariauna is tolerating letrozole well and the plan is to continue that another year. When she sees me at the next visit she will be ready to "graduate" from follow-up.  I don't know if some of the aches and pains she is having, which are mild to moderate, are due to the letrozole or simple arthritis. I let her know that we have a tai chi group here and encouraged her to participate.  Exercise alone of course is usually not enough to cause sustained weight loss. If she is interested in losing weight she can consider a low carbohydrate diet. The goal would be to lose between a half and 1 pound a month.  She will call with any problems that may develop before her next visit. Shavanna Furnari C      12/31/2015

## 2015-12-31 NOTE — Telephone Encounter (Signed)
Scheduled patient appt per pof, avs report printed.  °

## 2016-01-24 ENCOUNTER — Other Ambulatory Visit: Payer: Self-pay | Admitting: Nurse Practitioner

## 2016-01-25 ENCOUNTER — Other Ambulatory Visit: Payer: Self-pay | Admitting: *Deleted

## 2016-01-25 DIAGNOSIS — C50319 Malignant neoplasm of lower-inner quadrant of unspecified female breast: Secondary | ICD-10-CM

## 2016-01-25 MED ORDER — LETROZOLE 2.5 MG PO TABS: 2.5000 mg | ORAL_TABLET | Freq: Every day | ORAL | Status: AC

## 2016-06-10 ENCOUNTER — Telehealth: Payer: Self-pay | Admitting: Oncology

## 2016-06-10 NOTE — Telephone Encounter (Signed)
Appointment confirmed with patient. Heather Patton °

## 2016-10-31 ENCOUNTER — Other Ambulatory Visit: Payer: Self-pay | Admitting: Endocrinology

## 2016-10-31 DIAGNOSIS — Z853 Personal history of malignant neoplasm of breast: Secondary | ICD-10-CM

## 2016-12-21 ENCOUNTER — Other Ambulatory Visit: Payer: Self-pay | Admitting: *Deleted

## 2016-12-21 DIAGNOSIS — C50312 Malignant neoplasm of lower-inner quadrant of left female breast: Secondary | ICD-10-CM

## 2016-12-22 ENCOUNTER — Other Ambulatory Visit (HOSPITAL_BASED_OUTPATIENT_CLINIC_OR_DEPARTMENT_OTHER): Payer: Medicare Other

## 2016-12-22 DIAGNOSIS — Z853 Personal history of malignant neoplasm of breast: Secondary | ICD-10-CM | POA: Diagnosis not present

## 2016-12-22 DIAGNOSIS — C50312 Malignant neoplasm of lower-inner quadrant of left female breast: Secondary | ICD-10-CM

## 2016-12-22 LAB — CBC WITH DIFFERENTIAL/PLATELET
BASO%: 0.4 % (ref 0.0–2.0)
Basophils Absolute: 0 10*3/uL (ref 0.0–0.1)
EOS%: 2.7 % (ref 0.0–7.0)
Eosinophils Absolute: 0.2 10*3/uL (ref 0.0–0.5)
HEMATOCRIT: 44.4 % (ref 34.8–46.6)
HEMOGLOBIN: 14.8 g/dL (ref 11.6–15.9)
LYMPH#: 1.8 10*3/uL (ref 0.9–3.3)
LYMPH%: 27.1 % (ref 14.0–49.7)
MCH: 28.7 pg (ref 25.1–34.0)
MCHC: 33.3 g/dL (ref 31.5–36.0)
MCV: 86.1 fL (ref 79.5–101.0)
MONO#: 0.5 10*3/uL (ref 0.1–0.9)
MONO%: 8.3 % (ref 0.0–14.0)
NEUT%: 61.5 % (ref 38.4–76.8)
NEUTROS ABS: 4 10*3/uL (ref 1.5–6.5)
PLATELETS: 230 10*3/uL (ref 145–400)
RBC: 5.16 10*6/uL (ref 3.70–5.45)
RDW: 13.1 % (ref 11.2–14.5)
WBC: 6.5 10*3/uL (ref 3.9–10.3)

## 2016-12-22 LAB — COMPREHENSIVE METABOLIC PANEL
ALBUMIN: 4.1 g/dL (ref 3.5–5.0)
ALK PHOS: 81 U/L (ref 40–150)
ALT: 21 U/L (ref 0–55)
ANION GAP: 8 meq/L (ref 3–11)
AST: 19 U/L (ref 5–34)
BILIRUBIN TOTAL: 0.62 mg/dL (ref 0.20–1.20)
BUN: 18.3 mg/dL (ref 7.0–26.0)
CALCIUM: 9.8 mg/dL (ref 8.4–10.4)
CO2: 25 meq/L (ref 22–29)
CREATININE: 0.8 mg/dL (ref 0.6–1.1)
Chloride: 105 mEq/L (ref 98–109)
EGFR: 76 mL/min/{1.73_m2} — ABNORMAL LOW (ref >90)
Glucose: 110 mg/dl (ref 70–140)
Potassium: 4.2 mEq/L (ref 3.5–5.1)
Sodium: 139 mEq/L (ref 136–145)
TOTAL PROTEIN: 7.3 g/dL (ref 6.4–8.3)

## 2016-12-29 ENCOUNTER — Ambulatory Visit: Payer: Medicare Other | Admitting: Oncology

## 2017-01-03 ENCOUNTER — Ambulatory Visit (HOSPITAL_BASED_OUTPATIENT_CLINIC_OR_DEPARTMENT_OTHER): Payer: Medicare Other | Admitting: Oncology

## 2017-01-03 ENCOUNTER — Encounter: Payer: Self-pay | Admitting: Oncology

## 2017-01-03 VITALS — BP 143/73 | HR 75 | Temp 98.0°F | Resp 18 | Ht 64.0 in | Wt 230.9 lb

## 2017-01-03 DIAGNOSIS — Z853 Personal history of malignant neoplasm of breast: Secondary | ICD-10-CM | POA: Diagnosis not present

## 2017-01-03 DIAGNOSIS — Z170 Estrogen receptor positive status [ER+]: Secondary | ICD-10-CM

## 2017-01-03 DIAGNOSIS — C50312 Malignant neoplasm of lower-inner quadrant of left female breast: Secondary | ICD-10-CM

## 2017-01-03 NOTE — Progress Notes (Signed)
This document ID: Heather Patton   DOB: Mar 04, 1946  MR#: [id number]  HER#:[id number]  PCP: Heather Stack, MD GYN: SUStar Age, MD OTHER MD: Heather Pray, MD;  Heather Essex, MD  CHIEF COMPLAINT:  Estrogen receptor positiveLeft Breast Cancer  CURRENT TREATMENT: Completing 5 years of letrozole  BREAST CANCER HISTORY: From the original intake note : The patient had routine screening mammography at the breast center 09/21/2011. A possible mass was noted in the left breast, and she was recalled for additional views November 21, 29012. Diagnostic mammography showed a low density spiculated mass in the lower outer quadrant of the left breast measuring 6 mm. This was not palpable to the mammographer. Ultrasound showed only normal tissue in the area in question.  Stereotactic biopsy was performed 10/21/2011 and showed [phone] an invasive carcinoma with lobular features grade 1 or 2, with no her 2 amplification, 99% estrogen receptor positive, 94% progesterone receptor positive, with an MIB-1-1 of 15%.  With this information the patient underwent bilateral breast MRI 10/28/2011. A post biopsy hematoma was noted in the lower inner aspect of the left breast, with a small area of masslike enhancement associated with it. This area measured 7 mm. The MRI showed a second irregularly marginated mass in the central portion of the left breast 2.6 cm away from the biopsied mass. The second mass measured 5 mm maximally. There was no abnormality in the right breast and no adenopathy noted.  The patient was evaluated at the multidisciplinary breast clinic on 11/02/2011 and underwent definitive surgery 12/05/2011.   Her subsequent history is as detailed below.  INTERVAL HISTORY: Heather Patton returns today for follow-up of her estrogen receptor positive breast cancer. She tolerates letrozole well, and is completing 5 years on that medication next month.Hot flashes and vaginal dryness are not a major issue. She never  developed the arthralgias or myalgias that many patients can experience on this medication. She obtains it at a good price.    REVIEW OF SYSTEMS: She is having some facial hair and she wonders if that is due to the letrozole. She also has a little bit of morning phlegm at times. Arthritis is mild and unchanged. She tells me she had a basal cell carcinoma removed since her last visit here.  A detailed review of systems today was otherwise unremarkable   PAST MEDICAL HISTORY: Past Medical History:  Diagnosis Date  . Degenerative joint disease   . GERD (gastroesophageal reflux disease)   . Hx of colonic polyps   . Hyperlipidemia   . Hypothyroidism   . Osteopenia   . Radiation 02/28/12  6300 cGy   left breast  . Sleep apnea     PAST SURGICAL HISTORY: Past Surgical History:  Procedure Laterality Date  . ABDOMINAL HYSTERECTOMY    . BREAST BIOPSY  12/05/2011   left breast  . BUNIONECTOMY  1985   lt  . CERVICAL DISC SURGERY    . COLONOSCOPY    . TONSILLECTOMY      FAMILY HISTORY Family History  Problem Relation Age of Onset  . Cancer Mother     breast  . Cancer Cousin     breast  . Heart disease Father   the patient's father died at the age of 55 from a myocardial infarction. The patient's mother was diagnosed with breast cancer at age 86, but she died at age 10 following a fall. The patient had no brothers, has one sister. The only other breast cancer diagnosis in the family  was a cousin on the maternal side. The patient does not know at what age she was diagnosed. There is no history of ovarian cancer in the family.   GYNECOLOGIC HISTORY: GX P0. Menarche age 79, menopause 21 she took hormone replacement for about 10 years, discontinuing that in 2006.   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 Her husband Heather Patton is retired from the [employer]. The patient is a friend of one of my patients, Heather Patton.    ADVANCED DIRECTIVES: in  place  HEALTH MAINTENANCE:  (Updated January 2015) Social History  Substance Use Topics  . Smoking status: Former Smoker    Packs/day: 0.50    Quit date: 11/21/1978  . Smokeless tobacco: Never Used  . Alcohol use 3.6 oz/week    6 Glasses of wine per week     Colonoscopy: 2010/[facility]  PAP:UTD/[facility]  Bone density: Sept 2012/ [facility]  Lipid panel: UTD/[facility]  No Known Allergies  Current Outpatient Prescriptions  Medication Sig Dispense Refill  . aspirin EC 81 MG tablet Take 81 mg by mouth daily.    . calcium citrate-vitamin D (CITRACAL+D) 315-200 MG-UNIT per tablet Take 1 tablet by mouth daily. Calcium citrate 667m    Vitamin D  500iu     . docusate sodium (COLACE) 100 MG capsule Take 100 mg by mouth daily.     .Marland Kitchenezetimibe-simvastatin (VYTORIN) 10-80 MG per tablet Take 1 tablet by mouth at bedtime.      .Marland Kitchenletrozole (FEMARA) 2.5 MG tablet Take 1 tablet (2.5 mg total) by mouth daily. 90 tablet 3  . levothyroxine (SYNTHROID, LEVOTHROID) 150 MCG tablet Take 150 mcg by mouth daily.       No current facility-administered medications for this visit.     OBJECTIVE: Middle-aged white woman Who appears stated age  V76   01/03/17 0920  BP: (!) 143/73  Pulse: 75  Resp: 18  Temp: 98 F (36.7 Patton)     Body mass index is 39.63 kg/m.    ECOG FS: 0 Filed Weights   01/03/17 0920  Weight: 230 lb 14.4 oz (104.7 kg)   Sclerae unicteric, EOMs intact Oropharynx clear and moist No cervical or supraclavicular adenopathy Lungs no rales or rhonchi Heart regular rate and rhythm Abd soft, nontender, positive bowel sounds MSK no focal spinal tenderness, no upper extremity lymphedema Neuro: nonfocal, well oriented, appropriate affect Breasts: The right breast is benign. The left breast has undergone lumpectomy followed by radiation with no evidence of local recurrence. Both axillae are benign.  LAB RESULTS: Lab Results  Component Value Date   WBC 6.5 12/22/2016   NEUTROABS 4.0 12/22/2016    HGB 14.8 12/22/2016   HCT 44.4 12/22/2016   MCV 86.1 12/22/2016   PLT 230 12/22/2016      Chemistry      Component Value Date/Time   NA 139 12/22/2016 0957   K 4.2 12/22/2016 0957   CL 106 12/04/2012 1023   CO2 25 12/22/2016 0957   BUN 18.3 12/22/2016 0957   CREATININE 0.8 12/22/2016 0957      Component Value Date/Time   CALCIUM 9.8 12/22/2016 0957   ALKPHOS 81 12/22/2016 0957   AST 19 12/22/2016 0957   ALT 21 12/22/2016 0957   BILITOT 0.62 12/22/2016 0957      STUDIES: No results found.  ASSESSMENT: 71y.o. 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   (2)  She completed radiation April 2013  (3)  started letrozole, April 2013.   PLAN:  Breleigh Is now 5 years out from definitive surgery for her breast cancer with no evidence of disease recurrence. This is very favorable.  She will complete 5 years of letrozole next month.  In some patients who are not positive or otherwise at high risk of recurrence we generally recommend continuing aromatase inhibitors beyond 5 years. This is not Arthus case. The benefit of an additional 2 years of letrozole would be negligible in terms of risk reduction given her very good prognosis of   Accordingly I am comfortable with her stopping letrozole and returning to her primary care physician.  All she will need in terms of breast cancer follow-up is yearly mammography and a yearly physician breast  I will be glad to see [place] again at any point in the future if and when the need arises but as of now are making a further return appointment for her. Heather Patton,Heather Patton      01/03/2017

## 2017-01-05 ENCOUNTER — Ambulatory Visit
Admission: RE | Admit: 2017-01-05 | Discharge: 2017-01-05 | Disposition: A | Discharge status: Home / Self Care | Payer: Medicare Other | Source: Ambulatory Visit | Attending: Endocrinology | Admitting: Endocrinology

## 2017-01-05 DIAGNOSIS — Z853 Personal history of malignant neoplasm of breast: Secondary | ICD-10-CM

## 2017-01-05 HISTORY — DX: Personal history of irradiation: Z92.3

## 2017-11-28 ENCOUNTER — Other Ambulatory Visit: Payer: Self-pay | Admitting: Endocrinology

## 2017-11-28 DIAGNOSIS — Z9889 Other specified postprocedural states: Secondary | ICD-10-CM

## 2018-01-08 ENCOUNTER — Other Ambulatory Visit: Payer: Self-pay | Admitting: Endocrinology

## 2018-01-08 ENCOUNTER — Ambulatory Visit
Admission: RE | Admit: 2018-01-08 | Discharge: 2018-01-08 | Disposition: A | Discharge status: Home / Self Care | Payer: Medicare Other | Source: Ambulatory Visit | Attending: Endocrinology | Admitting: Endocrinology

## 2018-01-08 DIAGNOSIS — N63 Unspecified lump in unspecified breast: Secondary | ICD-10-CM

## 2018-01-08 DIAGNOSIS — Z9889 Other specified postprocedural states: Secondary | ICD-10-CM

## 2018-01-08 HISTORY — DX: Malignant neoplasm of unspecified site of unspecified female breast: C50.919

## 2018-01-10 ENCOUNTER — Other Ambulatory Visit: Payer: Self-pay | Admitting: Endocrinology

## 2018-01-10 ENCOUNTER — Ambulatory Visit
Admission: RE | Admit: 2018-01-10 | Discharge: 2018-01-10 | Disposition: A | Discharge status: Home / Self Care | Payer: Medicare Other | Source: Ambulatory Visit | Attending: Endocrinology | Admitting: Endocrinology

## 2018-01-10 DIAGNOSIS — Z9889 Other specified postprocedural states: Secondary | ICD-10-CM

## 2018-01-10 DIAGNOSIS — N63 Unspecified lump in unspecified breast: Secondary | ICD-10-CM

## 2018-01-15 ENCOUNTER — Ambulatory Visit
Admission: RE | Admit: 2018-01-15 | Discharge: 2018-01-15 | Disposition: A | Discharge status: Home / Self Care | Payer: Medicare Other | Source: Ambulatory Visit | Attending: Endocrinology | Admitting: Endocrinology

## 2018-01-15 ENCOUNTER — Other Ambulatory Visit: Payer: Self-pay | Admitting: Endocrinology

## 2018-01-15 DIAGNOSIS — Z9889 Other specified postprocedural states: Secondary | ICD-10-CM

## 2018-01-15 DIAGNOSIS — N63 Unspecified lump in unspecified breast: Secondary | ICD-10-CM

## 2018-03-20 DIAGNOSIS — M5030 Other cervical disc degeneration, unspecified cervical region: Secondary | ICD-10-CM | POA: Insufficient documentation

## 2018-03-27 ENCOUNTER — Ambulatory Visit (INDEPENDENT_AMBULATORY_CARE_PROVIDER_SITE_OTHER): Payer: Self-pay | Admitting: Physical Medicine and Rehabilitation

## 2018-12-19 ENCOUNTER — Other Ambulatory Visit: Payer: Self-pay | Admitting: Endocrinology

## 2018-12-19 DIAGNOSIS — Z853 Personal history of malignant neoplasm of breast: Secondary | ICD-10-CM

## 2019-01-29 ENCOUNTER — Ambulatory Visit
Admission: RE | Admit: 2019-01-29 | Discharge: 2019-01-29 | Disposition: A | Discharge status: Home / Self Care | Payer: Medicare Other | Source: Ambulatory Visit | Attending: Endocrinology | Admitting: Endocrinology

## 2019-01-29 DIAGNOSIS — Z853 Personal history of malignant neoplasm of breast: Secondary | ICD-10-CM

## 2019-12-11 ENCOUNTER — Ambulatory Visit: Payer: Medicare PPO | Attending: Internal Medicine

## 2019-12-11 DIAGNOSIS — Z23 Encounter for immunization: Secondary | ICD-10-CM | POA: Insufficient documentation

## 2019-12-11 NOTE — Progress Notes (Signed)
   Covid-19 Vaccination Clinic  Name:  MAUDENA BONTEMPS    MRN: [id number] DOB: 11/13/1946  12/11/2019  Ms. Wojahn was observed post Covid-19 immunization for 15 minutes without incidence. She was provided with Vaccine Information Sheet and instruction to access the V-Safe system.   Ms. Dechert was instructed to call 911 with any severe reactions post vaccine: Marland Kitchen Difficulty breathing  . Swelling of your face and throat  . A fast heartbeat  . A bad rash all over your body  . Dizziness and weakness    Immunizations Administered    Name Date Dose VIS Date Route   Pfizer COVID-19 Vaccine 12/11/2019  9:52 AM 0.3 mL 11/01/2019 Intramuscular   Manufacturer: [place]   Lot: [id number]   [place]: [id number]

## 2019-12-29 ENCOUNTER — Ambulatory Visit: Payer: Medicare PPO | Attending: Internal Medicine

## 2019-12-29 DIAGNOSIS — Z23 Encounter for immunization: Secondary | ICD-10-CM | POA: Insufficient documentation

## 2019-12-29 NOTE — Progress Notes (Signed)
   Covid-19 Vaccination Clinic  Name:  Heather Patton    MRN: [id number] DOB: [birth year]  12/29/2019  [name] was observed post Covid-19 immunization for 15 minutes without incidence. She was provided with Vaccine Information Sheet and instruction to access the V-Safe system.   [name] was instructed to call 911 with any severe reactions post vaccine: Marland Kitchen Difficulty breathing  . Swelling of your face and throat  . A fast heartbeat  . A bad rash all over your body  . Dizziness and weakness    Immunizations Administered    Name Date Dose VIS Date Route   Pfizer COVID-19 Vaccine 12/29/2019  1:16 PM 0.3 mL 11/01/2019 Intramuscular   Manufacturer: [place]   Lot: [id number]   [place]: [id number]

## 2019-12-30 ENCOUNTER — Ambulatory Visit: Payer: Self-pay

## 2020-01-20 ENCOUNTER — Other Ambulatory Visit: Payer: Self-pay | Admitting: Endocrinology

## 2020-01-20 DIAGNOSIS — Z1231 Encounter for screening mammogram for malignant neoplasm of breast: Secondary | ICD-10-CM

## 2020-02-25 ENCOUNTER — Other Ambulatory Visit: Payer: Self-pay

## 2020-02-25 ENCOUNTER — Ambulatory Visit
Admission: RE | Admit: 2020-02-25 | Discharge: 2020-02-25 | Disposition: A | Discharge status: Home / Self Care | Payer: Medicare PPO | Source: Ambulatory Visit | Attending: Endocrinology | Admitting: Endocrinology

## 2020-02-25 DIAGNOSIS — Z1231 Encounter for screening mammogram for malignant neoplasm of breast: Secondary | ICD-10-CM

## 2020-07-09 ENCOUNTER — Encounter: Payer: Self-pay | Admitting: Genetic Counselor

## 2021-01-21 ENCOUNTER — Other Ambulatory Visit: Payer: Self-pay | Admitting: Endocrinology

## 2021-01-21 DIAGNOSIS — Z1231 Encounter for screening mammogram for malignant neoplasm of breast: Secondary | ICD-10-CM

## 2021-03-17 ENCOUNTER — Ambulatory Visit
Admission: RE | Admit: 2021-03-17 | Discharge: 2021-03-17 | Disposition: A | Discharge status: Home / Self Care | Payer: Medicare PPO | Source: Ambulatory Visit | Attending: Endocrinology | Admitting: Endocrinology

## 2021-03-17 ENCOUNTER — Other Ambulatory Visit: Payer: Self-pay

## 2021-03-17 DIAGNOSIS — Z1231 Encounter for screening mammogram for malignant neoplasm of breast: Secondary | ICD-10-CM

## 2022-01-05 NOTE — Progress Notes (Deleted)
[facility] Note  01/11/2022     CHIEF COMPLAINT Patient presents for Retina Evaluation   HISTORY OF PRESENT ILLNESS: Heather Patton is a 76 y.o. female who presents to the clinic today for:   HPI     Retina Evaluation           Laterality: both eyes         Comments   Patient here for Retina Evaluation. Referred by Dr Ellie Lunch. Patient states vision is ok. Didn't know had anything wrong till saw Dr Ellie Lunch. Doctor saw a wrinkle in membrane in OD. No eye pain.       Last edited by Theodore Demark, COA on 01/11/2022  1:47 PM.      Referring physician: Luberta Mutter, MD [place] [address]  HISTORICAL INFORMATION:   Selected notes from the MEDICAL RECORD NUMBER Referred by Dr. Ellie Lunch Ocular Hx- Retinal Hole OS    CURRENT MEDICATIONS: No current outpatient medications on file. (Ophthalmic Drugs)   No current facility-administered medications for this visit. (Ophthalmic Drugs)   Current Outpatient Medications (Other)  Medication Sig   aspirin EC 81 MG tablet Take 81 mg by mouth daily.   calcium citrate-vitamin D (CITRACAL+D) 315-200 MG-UNIT per tablet Take 1 tablet by mouth daily. Calcium citrate 630mg     Vitamin D  500iu    ezetimibe-simvastatin (VYTORIN) 10-80 MG per tablet Take 1 tablet by mouth at bedtime.     levothyroxine (SYNTHROID, LEVOTHROID) 150 MCG tablet Take 150 mcg by mouth daily.     docusate sodium (COLACE) 100 MG capsule Take 100 mg by mouth daily.  (Patient not taking: Reported on 01/11/2022)   letrozole (FEMARA) 2.5 MG tablet Take 1 tablet (2.5 mg total) by mouth daily. (Patient not taking: Reported on 01/11/2022)   No current facility-administered medications for this visit. (Other)      REVIEW OF SYSTEMS: ROS   Positive for: Gastrointestinal, Musculoskeletal, Endocrine, Eyes, Respiratory Negative for: Constitutional, Neurological, Skin, Genitourinary, HENT, Cardiovascular, Psychiatric,  Allergic/Imm, Heme/Lymph Last edited by Leonie Douglas, COA on 01/11/2022  2:03 PM.       ALLERGIES No Known Allergies  PAST MEDICAL HISTORY Past Medical History:  Diagnosis Date   Breast cancer ([place]) 2013   Left   Degenerative joint disease    GERD (gastroesophageal reflux disease)    Hx of colonic polyps    Hyperlipidemia    Hypothyroidism    Osteopenia    Personal history of radiation therapy    April-May 2013    Radiation 02/28/12  6300 cGy   left breast   Sleep apnea    Past Surgical History:  Procedure Laterality Date   ABDOMINAL HYSTERECTOMY     BREAST BIOPSY  12/05/2011   left breast   BREAST LUMPECTOMY Left 2013   BUNIONECTOMY  1985   lt   CATARACT EXTRACTION     CERVICAL DISC SURGERY     COLONOSCOPY     TONSILLECTOMY      FAMILY HISTORY Family History  Problem Relation Age of Onset   Cancer Mother        breast   Breast cancer Mother 43   Heart disease Father    Cancer Cousin        breast   Breast cancer Cousin     SOCIAL HISTORY Social History   Tobacco Use   Smoking status: Former    Packs/day: 0.50    Types: Cigarettes  Quit date: 11/21/1978    Years since quitting: 43.1   Smokeless tobacco: Never  Vaping Use   Vaping Use: Never used  Substance Use Topics   Alcohol use: Yes    Alcohol/week: 6.0 standard drinks    Types: 6 Glasses of wine per week   Drug use: No         OPHTHALMIC EXAM:  Base Eye Exam     Visual Acuity (Snellen - Linear)       Right Left   Dist [place] 20/25 -2 20/20 -1   Dist ph [place] 20/20 -1   Patient is mono vision. OD Near used JCard. OS is Distance.        Tonometry (Tonopen, 1:43 PM)       Right Left   Pressure 13 10         Pupils       Dark Light Shape React APD   Right 2 1 Round Brisk None   Left 2 1 Round Brisk None         Visual Fields (Counting fingers)       Left Right    Full Full         Extraocular Movement       Right Left    Full, Ortho Full, Ortho          Neuro/Psych     Oriented x3: Yes   Mood/Affect: Normal         Dilation     Both eyes: 1.0% Mydriacyl, 2.5% Phenylephrine @ 1:43 PM           Slit Lamp and Fundus Exam     External Exam       Right Left   External Normal Normal         Slit Lamp Exam       Right Left   Lids/Lashes Dermatochalasis - upper lid, Meibomian gland dysfunction Dermatochalasis - upper lid, Meibomian gland dysfunction   Conjunctiva/Sclera nasal pingueculum White and quiet   Cornea Well healed temporal cataract wound Mild Punctate epithelial erosions, mild tear film debris, , Well healed temporal cataract wound   Anterior Chamber Deep and quiet Deep and quiet   Iris Round and dilated Round and dilated   Lens PC IOL in good position PC IOL in good position   Anterior Vitreous Vitreous syneresis Vitreous syneresis         Fundus Exam       Right Left   Disc Pink and sharp, mild PPP Pink and sharp, mild temporal PPA/PPP   C/D Ratio 0.5 0.4   Macula Flat, Good foveal reflex, RPE mottling Blunted foveal reflex, mild ERM with striae, no heme   Vessels Mild attenuation, Tortuous Mild attenuation, Tortuous   Periphery Attached, No heme, no RT/RD Pigmented lattice with atrophic hole 1230, vitreous condensation overlying           Refraction     Manifest Refraction       Sphere Cylinder Dist VA   Right -1.50 Sphere 20/20   Left +0.25 Sphere 20/20            IMAGING AND PROCEDURES  Imaging and Procedures for 01/11/2022           ASSESSMENT/PLAN:    ICD-10-CM   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 Epiretinal membrane (ERM) of left eye  H35.372 OCT, Retina - OU - Both Eyes      Lattice degeneration  w/ atrophic holes, OS - exam shows: OS Pigmented lattice with atrophic hole 1230, vitreous condensation overlying - discussed findings, prognosis, and treatment options including observation - recommend laser retinopexy - will bring patient back for  retinopexy laser - return Monday at 1pm for DFE, OCT, Retinopexy OS2.  2.   Epiretinal membrane, left eye  - The natural history, anatomy, potential for loss of vision, and treatment options including vitrectomy techniques and the complications of endophthalmitis, retinal detachment, vitreous hemorrhage, cataract progression and permanent vision loss discussed with the patient. - OCT shows: ERM with pucker, trace cystic changes - BCVA 20/20 - asymptomatic, no metamorphopsia - no indication for surgery at this time - monitor for now - f/u 3 mos -- DFE/OCT    Ophthalmic Meds Ordered this visit:  No orders of the defined types were placed in this encounter.      Return Return Monday, for DFE, OCT, Retinopexy OS.  There are no Patient Instructions on file for this visit.   Explained the diagnoses, plan, and follow up with the patient and they expressed understanding.  Patient expressed understanding of the importance of proper follow up care.   This document serves as a record of services personally performed by Gardiner Sleeper, MD, PhD. It was created on their behalf by San Jetty. Owens Shark, OA an ophthalmic technician. The creation of this record is the provider's dictation and/or activities during the visit.    Electronically signed by: San Jetty. Owens Shark, [place] 02.15.2023 3:37 PM   Gardiner Sleeper, M.D., Ph.D. Diseases & Surgery of the Retina and Vitreous [facility]: M myopia (nearsighted); A astigmatism; H hyperopia (farsighted); P presbyopia; Mrx spectacle prescription;  CTL contact lenses; OD right eye; OS left eye; OU both eyes  XT exotropia; ET esotropia; PEK punctate epithelial keratitis; PEE punctate epithelial erosions; DES dry eye syndrome; MGD meibomian gland dysfunction; ATs artificial tears; PFAT's preservative free artificial tears; [place] nuclear sclerotic cataract; PSC posterior subcapsular cataract; ERM epi-retinal membrane; PVD  posterior vitreous detachment; RD retinal detachment; DM diabetes mellitus; DR diabetic retinopathy; NPDR non-proliferative diabetic retinopathy; PDR proliferative diabetic retinopathy; CSME clinically significant macular edema; DME diabetic macular edema; dbh dot blot hemorrhages; CWS cotton wool spot; POAG primary open angle glaucoma; C/D cup-to-disc ratio; HVF humphrey visual field; GVF goldmann visual field; OCT optical coherence tomography; IOP intraocular pressure; BRVO Branch retinal vein occlusion; CRVO central retinal vein occlusion; CRAO central retinal artery occlusion; BRAO branch retinal artery occlusion; RT retinal tear; SB scleral buckle; PPV pars plana vitrectomy; VH Vitreous hemorrhage; PRP panretinal laser photocoagulation; IVK intravitreal kenalog; VMT vitreomacular traction; MH Macular hole;  NVD neovascularization of the disc; NVE neovascularization elsewhere; AREDS age related eye disease study; ARMD age related macular degeneration; POAG primary open angle glaucoma; EBMD epithelial/anterior basement membrane dystrophy; ACIOL anterior chamber intraocular lens; IOL intraocular lens; PCIOL posterior chamber intraocular lens; Phaco/IOL phacoemulsification with intraocular lens placement; [place] photorefractive keratectomy; LASIK laser assisted in situ keratomileusis; HTN hypertension; DM diabetes mellitus; COPD chronic obstructive pulmonary disease

## 2022-01-11 ENCOUNTER — Encounter (INDEPENDENT_AMBULATORY_CARE_PROVIDER_SITE_OTHER): Payer: Self-pay | Admitting: Ophthalmology

## 2022-01-11 ENCOUNTER — Other Ambulatory Visit: Payer: Self-pay

## 2022-01-11 ENCOUNTER — Ambulatory Visit (INDEPENDENT_AMBULATORY_CARE_PROVIDER_SITE_OTHER): Payer: Medicare PPO | Admitting: Ophthalmology

## 2022-01-11 DIAGNOSIS — Z961 Presence of intraocular lens: Secondary | ICD-10-CM | POA: Diagnosis not present

## 2022-01-11 DIAGNOSIS — H35372 Puckering of macula, left eye: Secondary | ICD-10-CM | POA: Diagnosis not present

## 2022-01-11 DIAGNOSIS — H35412 Lattice degeneration of retina, left eye: Secondary | ICD-10-CM | POA: Diagnosis not present

## 2022-01-11 DIAGNOSIS — H3581 Retinal edema: Secondary | ICD-10-CM

## 2022-01-12 ENCOUNTER — Encounter (INDEPENDENT_AMBULATORY_CARE_PROVIDER_SITE_OTHER): Payer: Self-pay | Admitting: Ophthalmology

## 2022-01-12 NOTE — Progress Notes (Signed)
[facility] Note  01/11/2022     CHIEF COMPLAINT Patient presents for Retina Evaluation   HISTORY OF PRESENT ILLNESS: Heather Patton is a 76 y.o. female who presents to the clinic today for:   HPI     Retina Evaluation   In both eyes.  I, the attending physician,  performed the HPI with the patient and updated documentation appropriately.        Comments   Patient here for Retina Evaluation. Referred by Dr Ellie Lunch. Patient states vision is ok. Didn't know had anything wrong till saw Dr Ellie Lunch. Doctor saw a wrinkle in membrane in OD. No eye pain.       Last edited by Bernarda Caffey, MD on 01/12/2022  1:20 AM.      Referring physician: Luberta Mutter, MD [place] [address]  HISTORICAL INFORMATION:   Selected notes from the MEDICAL RECORD NUMBER Referred by Dr. Ellie Lunch Ocular Hx- Retinal Hole OS    CURRENT MEDICATIONS: No current outpatient medications on file. (Ophthalmic Drugs)   No current facility-administered medications for this visit. (Ophthalmic Drugs)   Current Outpatient Medications (Other)  Medication Sig   aspirin EC 81 MG tablet Take 81 mg by mouth daily.   calcium citrate-vitamin D (CITRACAL+D) 315-200 MG-UNIT per tablet Take 1 tablet by mouth daily. Calcium citrate 630mg     Vitamin D  500iu    ezetimibe-simvastatin (VYTORIN) 10-80 MG per tablet Take 1 tablet by mouth at bedtime.     levothyroxine (SYNTHROID, LEVOTHROID) 150 MCG tablet Take 150 mcg by mouth daily.     docusate sodium (COLACE) 100 MG capsule Take 100 mg by mouth daily.  (Patient not taking: Reported on 01/11/2022)   letrozole (FEMARA) 2.5 MG tablet Take 1 tablet (2.5 mg total) by mouth daily. (Patient not taking: Reported on 01/11/2022)   No current facility-administered medications for this visit. (Other)   REVIEW OF SYSTEMS: ROS   Positive for: Gastrointestinal, Musculoskeletal, Endocrine, Eyes, Respiratory Negative for:  Constitutional, Neurological, Skin, Genitourinary, HENT, Cardiovascular, Psychiatric, Allergic/Imm, Heme/Lymph Last edited by Leonie Douglas, COA on 01/11/2022  2:03 PM.     ALLERGIES No Known Allergies  PAST MEDICAL HISTORY Past Medical History:  Diagnosis Date   Breast cancer ([place]) 2013   Left   Degenerative joint disease    GERD (gastroesophageal reflux disease)    Hx of colonic polyps    Hyperlipidemia    Hypothyroidism    Osteopenia    Personal history of radiation therapy    April-May 2013    Radiation 02/28/12  6300 cGy   left breast   Sleep apnea    Past Surgical History:  Procedure Laterality Date   ABDOMINAL HYSTERECTOMY     BREAST BIOPSY  12/05/2011   left breast   BREAST LUMPECTOMY Left 2013   BUNIONECTOMY  1985   lt   CATARACT EXTRACTION     CERVICAL DISC SURGERY     COLONOSCOPY     TONSILLECTOMY     FAMILY HISTORY Family History  Problem Relation Age of Onset   Cancer Mother        breast   Breast cancer Mother 21   Heart disease Father    Cancer Cousin        breast   Breast cancer Cousin    SOCIAL HISTORY Social History   Tobacco Use   Smoking status: Former    Packs/day: 0.50    Types: Cigarettes  Quit date: 11/21/1978    Years since quitting: 43.1   Smokeless tobacco: Never  Vaping Use   Vaping Use: Never used  Substance Use Topics   Alcohol use: Yes    Alcohol/week: 6.0 standard drinks    Types: 6 Glasses of wine per week   Drug use: No       OPHTHALMIC EXAM:  Base Eye Exam     Visual Acuity (Snellen - Linear)       Right Left   Dist [place] 20/25 -2 20/20 -1   Dist ph [place] 20/20 -1   Patient is mono vision. OD Near used JCard. OS is Distance.        Tonometry (Tonopen, 1:43 PM)       Right Left   Pressure 13 10         Pupils       Dark Light Shape React APD   Right 2 1 Round Brisk None   Left 2 1 Round Brisk None         Visual Fields (Counting fingers)       Left Right    Full Full          Extraocular Movement       Right Left    Full, Ortho Full, Ortho         Neuro/Psych     Oriented x3: Yes   Mood/Affect: Normal         Dilation     Both eyes: 1.0% Mydriacyl, 2.5% Phenylephrine @ 1:43 PM           Slit Lamp and Fundus Exam     External Exam       Right Left   External Normal Normal         Slit Lamp Exam       Right Left   Lids/Lashes Dermatochalasis - upper lid, Meibomian gland dysfunction Dermatochalasis - upper lid, Meibomian gland dysfunction   Conjunctiva/Sclera nasal pingueculum White and quiet   Cornea Well healed temporal cataract wound Mild Punctate epithelial erosions, mild tear film debris, , Well healed temporal cataract wound   Anterior Chamber Deep and quiet Deep and quiet   Iris Round and dilated Round and dilated   Lens PC IOL in good position PC IOL in good position   Anterior Vitreous Vitreous syneresis Vitreous syneresis         Fundus Exam       Right Left   Disc Pink and sharp, mild PPP Pink and sharp, mild temporal PPA/PPP   C/D Ratio 0.5 0.4   Macula Flat, Good foveal reflex, RPE mottling Blunted foveal reflex, mild ERM with striae, no heme   Vessels Mild attenuation, Tortuous Mild attenuation, Tortuous   Periphery Attached, No heme, no RT/RD Pigmented lattice with atrophic hole 1230--vitreous condensation overlying           Refraction     Manifest Refraction       Sphere Cylinder Dist VA   Right -1.50 Sphere 20/20   Left +0.25 Sphere 20/20           IMAGING AND PROCEDURES  Imaging and Procedures for 01/11/2022  OCT, Retina - OU - Both Eyes       Right Eye Quality was good. Central Foveal Thickness: 276. Progression has no prior data. Findings include normal foveal contour, no IRF, no SRF.   Left Eye Quality was good. Central Foveal Thickness: 374. Progression has no prior data. Findings include epiretinal  membrane, abnormal foveal contour, macular pucker, no IRF, no SRF (ERM with pucker,  trace cystic changes).   Notes *Images captured and stored on drive  Diagnosis / Impression:  OD: NFP; no IRF/SRF  OS: ERM with pucker, trace cystic changes  Clinical management:  See below  Abbreviations: NFP - Normal foveal profile. CME - cystoid macular edema. PED - pigment epithelial detachment. IRF - intraretinal fluid. SRF - subretinal fluid. EZ - ellipsoid zone. ERM - epiretinal membrane. ORA - outer retinal atrophy. ORT - outer retinal tubulation. SRHM - subretinal hyper-reflective material. 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 Epiretinal membrane (ERM) of left eye  H35.372 OCT, Retina - OU - Both Eyes    3. Pseudophakia of both eyes  Z96.1      1. Lattice degeneration w/ atrophic holes, OS - exam shows Pigmented lattice with atrophic hole 1230, vitreous condensation overlying - discussed findings, prognosis, and treatment options including observation - recommend laser retinopexy OS - will bring patient back for retinopexy laser - return Monday at 1pm for DFE, OCT, Retinopexy OS.  2. Epiretinal membrane, left eye  - The natural history, anatomy, potential for loss of vision, and treatment options including vitrectomy techniques and the complications of endophthalmitis, retinal detachment, vitreous hemorrhage, cataract progression and permanent vision loss discussed with the patient. - OCT shows: mild ERM with pucker, trace cystic changes OS - BCVA 20/20 - asymptomatic, no metamorphopsia - no indication for surgery at this time - monitor for now - f/u 3 mos -- DFE/OCT   3. Pseudophakia OU  - s/p CE/IOL OU by expert surgeon, Dr. Ellie Lunch  - IOLs in perfect position, doing well  - monitor    Ophthalmic Meds Ordered this visit:  No orders of the defined types were placed in this encounter.    Return in 6 days (on 01/17/2022) for DFE, OCT, Laser Retinopexy  OS.  There are no Patient Instructions on file for this visit.   Explained the diagnoses, plan, and follow up with the patient and they expressed understanding.  Patient expressed understanding of the importance of proper follow up care.   This document serves as a record of services personally performed by Gardiner Sleeper, MD, PhD. It was created on their behalf by San Jetty. Owens Shark, OA an ophthalmic technician. The creation of this record is the provider's dictation and/or activities during the visit.    Electronically signed by: San Jetty. Owens Shark, [place] 02.15.2023 1:39 AM  Gardiner Sleeper, M.D., Ph.D. Diseases & Surgery of the Retina and Vitreous [facility]  I have reviewed the above documentation for accuracy and completeness, and I agree with the above. Gardiner Sleeper, M.D., Ph.D. 01/12/22 1:39 AM  Abbreviations: M myopia (nearsighted); A astigmatism; H hyperopia (farsighted); P presbyopia; Mrx spectacle prescription;  CTL contact lenses; OD right eye; OS left eye; OU both eyes  XT exotropia; ET esotropia; PEK punctate epithelial keratitis; PEE punctate epithelial erosions; DES dry eye syndrome; MGD meibomian gland dysfunction; ATs artificial tears; PFAT's preservative free artificial tears; [place] nuclear sclerotic cataract; PSC posterior subcapsular cataract; ERM epi-retinal membrane; PVD posterior vitreous detachment; RD retinal detachment; DM diabetes mellitus; DR diabetic retinopathy; NPDR non-proliferative diabetic retinopathy; PDR proliferative diabetic retinopathy; CSME clinically significant macular edema; DME diabetic macular edema; dbh dot blot hemorrhages; CWS  cotton wool spot; POAG primary open angle glaucoma; C/D cup-to-disc ratio; HVF humphrey visual field; GVF goldmann visual field; OCT optical coherence tomography; IOP intraocular pressure; BRVO Branch retinal vein occlusion; CRVO central retinal vein occlusion; CRAO central retinal artery occlusion; BRAO branch  retinal artery occlusion; RT retinal tear; SB scleral buckle; PPV pars plana vitrectomy; VH Vitreous hemorrhage; PRP panretinal laser photocoagulation; IVK intravitreal kenalog; VMT vitreomacular traction; MH Macular hole;  NVD neovascularization of the disc; NVE neovascularization elsewhere; AREDS age related eye disease study; ARMD age related macular degeneration; POAG primary open angle glaucoma; EBMD epithelial/anterior basement membrane dystrophy; ACIOL anterior chamber intraocular lens; IOL intraocular lens; PCIOL posterior chamber intraocular lens; Phaco/IOL phacoemulsification with intraocular lens placement; [place] photorefractive keratectomy; LASIK laser assisted in situ keratomileusis; HTN hypertension; DM diabetes mellitus; COPD chronic obstructive pulmonary disease

## 2022-01-13 NOTE — Progress Notes (Addendum)
[facility] Note  01/17/2022    CHIEF COMPLAINT Patient presents for Retina Follow Up  HISTORY OF PRESENT ILLNESS: Heather Patton is a 76 y.o. female who presents to the clinic today for:   HPI     Retina Follow Up   Patient presents with  Retinal Break/Detachment.  In left eye.  Severity is mild.  Duration of 6 days.  Since onset it is stable.  I, the attending physician,  performed the HPI with the patient and updated documentation appropriately.        Comments   Pt states over the past 3-4 months she has noticed a decline in OS vision, pt is not using any drops, no new fol or floaters      Last edited by Bernarda Caffey, MD on 01/17/2022  1:46 PM.     Pt here for laser retinopexy OS  Referring physician: Luberta Mutter, MD [place] [address]  HISTORICAL INFORMATION:   Selected notes from the MEDICAL RECORD NUMBER Referred by Dr. Ellie Lunch Ocular Hx- Retinal Hole OS    CURRENT MEDICATIONS: No current outpatient medications on file. (Ophthalmic Drugs)   No current facility-administered medications for this visit. (Ophthalmic Drugs)   Current Outpatient Medications (Other)  Medication Sig   aspirin EC 81 MG tablet Take 81 mg by mouth daily.   calcium citrate-vitamin D (CITRACAL+D) 315-200 MG-UNIT per tablet Take 1 tablet by mouth daily. Calcium citrate 630mg     Vitamin D  500iu    docusate sodium (COLACE) 100 MG capsule Take 100 mg by mouth daily.  (Patient not taking: Reported on 01/11/2022)   ezetimibe-simvastatin (VYTORIN) 10-80 MG per tablet Take 1 tablet by mouth at bedtime.     letrozole (FEMARA) 2.5 MG tablet Take 1 tablet (2.5 mg total) by mouth daily. (Patient not taking: Reported on 01/11/2022)   levothyroxine (SYNTHROID, LEVOTHROID) 150 MCG tablet Take 150 mcg by mouth daily.     No current facility-administered medications for this visit. (Other)   REVIEW OF SYSTEMS: ROS   Positive for:  Musculoskeletal, Endocrine, Eyes Negative for: Constitutional, Gastrointestinal, Neurological, Skin, Genitourinary, HENT, Cardiovascular, Respiratory, Psychiatric, Allergic/Imm, Heme/Lymph Last edited by Debbrah Alar, COT on 01/17/2022 12:58 PM.     ALLERGIES No Known Allergies  PAST MEDICAL HISTORY Past Medical History:  Diagnosis Date   Breast cancer ([place]) 2013   Left   Degenerative joint disease    GERD (gastroesophageal reflux disease)    Hx of colonic polyps    Hyperlipidemia    Hypothyroidism    Osteopenia    Personal history of radiation therapy    April-May 2013    Radiation 02/28/12  6300 cGy   left breast   Sleep apnea    Past Surgical History:  Procedure Laterality Date   ABDOMINAL HYSTERECTOMY     BREAST BIOPSY  12/05/2011   left breast   BREAST LUMPECTOMY Left 2013   BUNIONECTOMY  1985   lt   CATARACT EXTRACTION     CERVICAL DISC SURGERY     COLONOSCOPY     TONSILLECTOMY     FAMILY HISTORY Family History  Problem Relation Age of Onset   Cancer Mother        breast   Breast cancer Mother 56   Heart disease Father    Cancer Cousin        breast   Breast cancer Cousin    SOCIAL HISTORY Social History  Tobacco Use   Smoking status: Former    Packs/day: 0.50    Types: Cigarettes    Quit date: 11/21/1978    Years since quitting: 43.1   Smokeless tobacco: Never  Vaping Use   Vaping Use: Never used  Substance Use Topics   Alcohol use: Yes    Alcohol/week: 6.0 standard drinks    Types: 6 Glasses of wine per week   Drug use: No       OPHTHALMIC EXAM:  Base Eye Exam     Visual Acuity (Snellen - Linear)       Right Left   Dist [place] 20/20 -1 20/20 -2   Dist ph [place] NI NI  OD: J card used        Tonometry (Tonopen, 1:06 PM)       Right Left   Pressure 16 17         Pupils       Dark Light Shape React APD   Right 2 1 Round Minimal None   Left 2 1 Round Minimal None         Neuro/Psych     Oriented x3: Yes   Mood/Affect:  Normal         Dilation     Left eye: 1.0% Mydriacyl @ 1:06 PM  10% phenyl          Slit Lamp and Fundus Exam     External Exam       Right Left   External Normal Normal         Slit Lamp Exam       Right Left   Lids/Lashes Dermatochalasis - upper lid, Meibomian gland dysfunction Dermatochalasis - upper lid, Meibomian gland dysfunction   Conjunctiva/Sclera nasal pingueculum White and quiet   Cornea Well healed temporal cataract wound Mild Punctate epithelial erosions, mild tear film debris, , Well healed temporal cataract wound   Anterior Chamber Deep and quiet Deep and quiet   Iris Round and dilated Round and dilated   Lens PC IOL in good position PC IOL in good position   Anterior Vitreous Vitreous syneresis Vitreous syneresis         Fundus Exam       Right Left   Disc Pink and sharp, mild PPP Pink and sharp, mild temporal PPA/PPP   C/D Ratio 0.5 0.4   Macula Flat, Good foveal reflex, RPE mottling Blunted foveal reflex, mild ERM with striae, no heme   Vessels Mild attenuation, Tortuous Mild attenuation, Tortuous   Periphery Attached, No heme, no RT/RD Pigmented lattice with atrophic hole 1230--vitreous condensation overlying           IMAGING AND PROCEDURES  Imaging and Procedures for 01/17/2022  OCT, Retina - OU - Both Eyes       Right Eye Quality was good. Central Foveal Thickness: 278. Progression has been stable. Findings include normal foveal contour, no IRF, no SRF.   Left Eye Quality was good. Central Foveal Thickness: 382. Progression has been stable. Findings include epiretinal membrane, abnormal foveal contour, macular pucker, no IRF, no SRF (ERM with pucker, trace cystic changes).   Notes *Images captured and stored on drive  Diagnosis / Impression:  OD: NFP; no IRF/SRF  OS: ERM with pucker, trace cystic changes  Clinical management:  See below  Abbreviations: NFP - Normal foveal profile. CME - cystoid macular edema. PED -  pigment epithelial detachment. IRF - intraretinal fluid. SRF - subretinal fluid. EZ - ellipsoid zone. ERM -  epiretinal membrane. ORA - outer retinal atrophy. ORT - outer retinal tubulation. SRHM - subretinal hyper-reflective material. IRHM - intraretinal hyper-reflective material      Repair Retinal Breaks, Laser - OS - Left Eye       LASER PROCEDURE NOTE  Procedure:  Barrier laser retinopexy using slit lamp laser, LEFT eye   Diagnosis:   Lattice degeneration w/ atrophic holes, LEFT eye                     Pigmented lattice at 1230  Surgeon: Bernarda Caffey, MD, PhD  Anesthesia: Topical  Informed consent obtained, operative eye marked, and time out performed prior to initiation of laser.   Laser settings:  Lumenis [id number] laser, slit lamp Lens: [place] PRP 165 Power: 300 mW Spot size: 200 microns Duration: 30 msec  # spots: 367  Placement of laser: Using a [place] PRP 165 contact lens at the slit lamp, laser was placed in three confluent rows around pigmented lattice w/ atrophic holes at 1230 oclock anterior to equator  Complications: None.  Patient tolerated the procedure well and received written and verbal post-procedure care information/education.            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 Retinal hole of left eye  H33.322 Repair Retinal Breaks, Laser - OS - Left Eye    3. Epiretinal membrane (ERM) of left eye  H35.372     4. Pseudophakia of both eyes  Z96.1      1,2. Lattice degeneration w/ atrophic holes, OS - exam shows Pigmented lattice with atrophic hole 1230, vitreous condensation overlying - recommend laser retinopexy OS today, 02.27.23 - pt wishes to proceed with laser - RBA of procedure discussed, questions answered - informed consent obtained and signed - see procedure note - start Lotemax QID OS x5-7 days - f/u  3-4 weeks, DFE, OCT  3. Epiretinal membrane, left eye  - The natural history, anatomy, potential for loss of vision, and treatment options including vitrectomy techniques and the complications of endophthalmitis, retinal detachment, vitreous hemorrhage, cataract progression and permanent vision loss discussed with the patient. - OCT shows: mild ERM with pucker, trace cystic changes OS - BCVA 20/20 - asymptomatic, no metamorphopsia - no indication for surgery at this time - monitor for now - f/u 3 months DFE/OCT  4. Pseudophakia OU  - s/p CE/IOL OU by expert surgeon, Dr. Ellie Lunch  - IOLs in perfect position, doing well  - monitor  Ophthalmic Meds Ordered this visit:  No orders of the defined types were placed in this encounter.    Return for f/u 3-4 weeks, lattice degeneration OS, DFE, OCT.  There are no Patient Instructions on file for this visit.   Explained the diagnoses, plan, and follow up with the patient and they expressed understanding.  Patient expressed understanding of the importance of proper follow up care.   This document serves as a record of services personally performed by Gardiner Sleeper, MD, PhD. It was created on their behalf by Roselee Nova, COMT. The creation of this record is the provider's dictation and/or activities during the visit.  Electronically signed by: Roselee Nova, COMT 01/17/22 1:50 PM   Gardiner Sleeper, M.D., Ph.D. Diseases & Surgery of the Retina and [place]  I have reviewed the above documentation for accuracy and completeness, and I agree with the above. Gardiner Sleeper, M.D., Ph.D. 01/17/22 1:50 PM   Abbreviations: M myopia (nearsighted); A astigmatism; H hyperopia (farsighted); P presbyopia; Mrx spectacle prescription;  CTL contact lenses; OD right eye; OS left eye; OU both eyes  XT exotropia; ET esotropia; PEK punctate epithelial keratitis; PEE punctate epithelial erosions; DES dry eye syndrome; MGD  meibomian gland dysfunction; ATs artificial tears; PFAT's preservative free artificial tears; [place] nuclear sclerotic cataract; PSC posterior subcapsular cataract; ERM epi-retinal membrane; PVD posterior vitreous detachment; RD retinal detachment; DM diabetes mellitus; DR diabetic retinopathy; NPDR non-proliferative diabetic retinopathy; PDR proliferative diabetic retinopathy; CSME clinically significant macular edema; DME diabetic macular edema; dbh dot blot hemorrhages; CWS cotton wool spot; POAG primary open angle glaucoma; C/D cup-to-disc ratio; HVF humphrey visual field; GVF goldmann visual field; OCT optical coherence tomography; IOP intraocular pressure; BRVO Branch retinal vein occlusion; CRVO central retinal vein occlusion; CRAO central retinal artery occlusion; BRAO branch retinal artery occlusion; RT retinal tear; SB scleral buckle; PPV pars plana vitrectomy; VH Vitreous hemorrhage; PRP panretinal laser photocoagulation; IVK intravitreal kenalog; VMT vitreomacular traction; MH Macular hole;  NVD neovascularization of the disc; NVE neovascularization elsewhere; AREDS age related eye disease study; ARMD age related macular degeneration; POAG primary open angle glaucoma; EBMD epithelial/anterior basement membrane dystrophy; ACIOL anterior chamber intraocular lens; IOL intraocular lens; PCIOL posterior chamber intraocular lens; Phaco/IOL phacoemulsification with intraocular lens placement; [place] photorefractive keratectomy; LASIK laser assisted in situ keratomileusis; HTN hypertension; DM diabetes mellitus; COPD chronic obstructive pulmonary disease

## 2022-01-17 ENCOUNTER — Other Ambulatory Visit: Payer: Self-pay

## 2022-01-17 ENCOUNTER — Ambulatory Visit (INDEPENDENT_AMBULATORY_CARE_PROVIDER_SITE_OTHER): Payer: Medicare PPO | Admitting: Ophthalmology

## 2022-01-17 ENCOUNTER — Encounter (INDEPENDENT_AMBULATORY_CARE_PROVIDER_SITE_OTHER): Payer: Self-pay | Admitting: Ophthalmology

## 2022-01-17 DIAGNOSIS — H35372 Puckering of macula, left eye: Secondary | ICD-10-CM

## 2022-01-17 DIAGNOSIS — H35412 Lattice degeneration of retina, left eye: Secondary | ICD-10-CM | POA: Diagnosis not present

## 2022-01-17 DIAGNOSIS — H33322 Round hole, left eye: Secondary | ICD-10-CM | POA: Diagnosis not present

## 2022-01-17 DIAGNOSIS — Z961 Presence of intraocular lens: Secondary | ICD-10-CM

## 2022-02-03 ENCOUNTER — Other Ambulatory Visit: Payer: Self-pay | Admitting: Endocrinology

## 2022-02-11 NOTE — Progress Notes (Signed)
?[facility] Note ? ?02/14/2022 ?  ? ?CHIEF COMPLAINT ?Patient presents for Retina Follow Up ? ?HISTORY OF PRESENT ILLNESS: ?Heather Patton is a 76 y.o. female who presents to the clinic today for:  ? ?HPI   ? ? Retina Follow Up   ?Patient presents with  Other.  In left eye.  This started 4 weeks ago.  I, the attending physician,  performed the HPI with the patient and updated documentation appropriately. ? ?  ?  ? ? Comments   ?Patient here for 4 weeks retina follow up for lattice degeneration OS. Patient states vision doing the same. No eye pain.  ? ?  ?  ?Last edited by Bernarda Caffey, MD on 02/14/2022  1:42 PM.  ?  ? ?Referring physician: ?Luberta Mutter, MD ?[address]  ?[place] [address] ? ?HISTORICAL INFORMATION:  ? ?Selected notes from the [place] ?Referred by Dr. Ellie Lunch ?Ocular Hx- Retinal Hole OS ?  ? ?CURRENT MEDICATIONS: ?No current outpatient medications on file. (Ophthalmic Drugs)  ? ?No current facility-administered medications for this visit. (Ophthalmic Drugs)  ? ?Current Outpatient Medications (Other)  ?Medication Sig  ? aspirin EC 81 MG tablet Take 81 mg by mouth daily.  ? calcium citrate-vitamin D (CITRACAL+D) 315-200 MG-UNIT per tablet Take 1 tablet by mouth daily. Calcium citrate '630mg'$     Vitamin D  500iu   ? ezetimibe-simvastatin (VYTORIN) 10-80 MG per tablet Take 1 tablet by mouth at bedtime.    ? levothyroxine (SYNTHROID, LEVOTHROID) 150 MCG tablet Take 150 mcg by mouth daily.    ? docusate sodium (COLACE) 100 MG capsule Take 100 mg by mouth daily.  (Patient not taking: Reported on 01/11/2022)  ? letrozole (FEMARA) 2.5 MG tablet Take 1 tablet (2.5 mg total) by mouth daily. (Patient not taking: Reported on 01/11/2022)  ? ?No current facility-administered medications for this visit. (Other)  ? ?REVIEW OF SYSTEMS: ?ROS   ?Positive for: Musculoskeletal, Endocrine, Eyes ?Negative for: Constitutional, Gastrointestinal, Neurological, Skin,  Genitourinary, HENT, Cardiovascular, Respiratory, Psychiatric, Allergic/Imm, Heme/Lymph ?Last edited by Theodore Demark, COA on 02/14/2022  1:07 PM.  ?  ? ?ALLERGIES ?No Known Allergies ? ?PAST MEDICAL HISTORY ?Past Medical History:  ?Diagnosis Date  ? Breast cancer ([place]) 2013  ? Left  ? Degenerative joint disease   ? GERD (gastroesophageal reflux disease)   ? Hx of colonic polyps   ? Hyperlipidemia   ? Hypothyroidism   ? Osteopenia   ? Personal history of radiation therapy   ? April-May 2013   ? Radiation 02/28/12  6300 cGy  ? left breast  ? Sleep apnea   ? ?Past Surgical History:  ?Procedure Laterality Date  ? ABDOMINAL HYSTERECTOMY    ? BREAST BIOPSY  12/05/2011  ? left breast  ? BREAST LUMPECTOMY Left 2013  ? [place]  ? lt  ? CATARACT EXTRACTION    ? CERVICAL DISC SURGERY    ? COLONOSCOPY    ? TONSILLECTOMY    ? ?FAMILY HISTORY ?Family History  ?Problem Relation Age of Onset  ? Cancer Mother   ?     breast  ? Breast cancer Mother 18  ? Heart disease Father   ? Cancer Cousin   ?     breast  ? Breast cancer Cousin   ? ?SOCIAL HISTORY ?Social History  ? ?Tobacco Use  ? Smoking status: Former  ?  Packs/day: 0.50  ?  Types: Cigarettes  ?  Quit date: 11/21/1978  ?  Years since quitting: 43.2  ? Smokeless tobacco: Never  ?Vaping Use  ? Vaping Use: Never used  ?Substance Use Topics  ? Alcohol use: Yes  ?  Alcohol/week: 6.0 standard drinks  ?  Types: 6 Glasses of wine per week  ? Drug use: No  ?  ? ?  ?OPHTHALMIC EXAM: ? ?Base Eye Exam   ? ? Visual Acuity (Snellen - Linear)   ? ?   Right Left  ? Dist [place] 20/25 -3 20/20 -1  ?Patient mono vision OD near [place]. OS distance. ? ?  ?  ? ? Tonometry (Tonopen, 1:04 PM)   ? ?   Right Left  ? Pressure 14 14  ? ?  ?  ? ? Pupils   ? ?   Dark Light Shape React APD  ? Right 2 1 Round Minimal None  ? Left 2 1 Round Minimal None  ? ?  ?  ? ? Visual Fields (Counting fingers)   ? ?   Left Right  ?  Full Full  ? ?  ?  ? ? Extraocular Movement   ? ?   Right Left  ?  Full, Ortho Full,  Ortho  ? ?  ?  ? ? Neuro/Psych   ? ? Oriented x3: Yes  ? Mood/Affect: Normal  ? ?  ?  ? ? Dilation   ? ? Both eyes: 1.0% Mydriacyl, 2.5% Phenylephrine @ 1:03 PM  ? ?  ?  ? ?  ? ?Slit Lamp and Fundus Exam   ? ? External Exam   ? ?   Right Left  ? External Normal Normal  ? ?  ?  ? ? Slit Lamp Exam   ? ?   Right Left  ? Lids/Lashes Dermatochalasis - upper lid, Meibomian gland dysfunction Dermatochalasis - upper lid, Meibomian gland dysfunction  ? Conjunctiva/Sclera nasal pingueculum White and quiet  ? Cornea Well healed temporal cataract wound, fine endo pigment Mild Punctate epithelial erosions, mild tear film debris, Well healed temporal cataract wound  ? Anterior Chamber Deep and quiet Deep and quiet  ? Iris Round and dilated Round and dilated  ? Lens PC IOL in good position PC IOL in good position  ? Anterior Vitreous Vitreous syneresis Vitreous syneresis  ? ?  ?  ? ? Fundus Exam   ? ?   Right Left  ? Disc Pink and sharp, mild PPP Pink and sharp, mild temporal PPA/PPP  ? C/D Ratio 0.6 0.4  ? Macula Flat, Good foveal reflex, RPE mottling Blunted foveal reflex, mild ERM with striae, no heme  ? Vessels attenuated, mild tortuosity attenuated, mild tortuosity  ? Periphery Attached, No heme, no RT/RD Pigmented lattice with atrophic hole at 1230 -- vitreous condensation overlying -- with good laser surrounding  ? ?  ?  ? ?  ? ?IMAGING AND PROCEDURES  ?Imaging and Procedures for 02/14/2022 ? ?OCT, Retina - OU - Both Eyes   ? ?   ?Right Eye ?Quality was good. Central Foveal Thickness: 275. Progression has been stable. Findings include normal foveal contour, no IRF, no SRF.  ? ?Left Eye ?Quality was good. Central Foveal Thickness: 385. Progression has been stable. Findings include epiretinal membrane, abnormal foveal contour, macular pucker, no IRF, no SRF (ERM with pucker, trace cystic changes).  ? ?Notes ?*Images captured and stored on drive ? ?Diagnosis / Impression:  ?OD: NFP; no IRF/SRF  ?OS: ERM with pucker, trace  cystic  changes -- stable from prior ? ?Clinical management:  ?See below ? ?Abbreviations: NFP - Normal foveal profile. CME - cystoid macular edema. PED - pigment epithelial detachment. IRF - intraretinal fluid. SRF - subretinal fluid. EZ - ellipsoid zone. ERM - epiretinal membrane. ORA - outer retinal atrophy. ORT - outer retinal tubulation. SRHM - subretinal hyper-reflective material. IRHM - intraretinal hyper-reflective material  ? ?  ? ?  ?  ? ?  ?ASSESSMENT/PLAN: ? ?  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  ?2. Retinal hole of left eye  H33.322   ?  ?3. Epiretinal membrane (ERM) of left eye  H35.372 OCT, Retina - OU - Both Eyes  ?  ?4. Pseudophakia of both eyes  Z96.1   ?  ? ? ?1,2. Lattice degeneration w/ atrophic holes, OS ?- Pigmented lattice with atrophic hole at 1230, vitreous condensation overlying ?- s/p laser retinopexy OS (02.27.23) -- good laser surrounding ?- completed Lotemax QID OS x5-7 days ?- no new RT/RD or lattice ?- f/u 4 months, DFE, OCT ? ?3. Epiretinal membrane, left eye  ?- OCT shows: mild ERM with pucker, trace cystic changes OS ?- BCVA 20/20 ?- asymptomatic, no metamorphopsia ?- no indication for surgery at this time ?- monitor ?- f/u 4 months DFE, OCT ? ?4. Pseudophakia OU ? - s/p CE/IOL OU by expert surgeon, Dr. Ellie Lunch ? - IOLs in perfect position, doing well ? - monitor ? ?Ophthalmic Meds Ordered this visit:  ?No orders of the defined types were placed in this encounter. ?  ? ?Return in about 4 months (around 06/16/2022) for f/u ERM OS, DFE, OCT. ? ?There are no Patient Instructions on file for this visit. ? ? ?Explained the diagnoses, plan, and follow up with the patient and they expressed understanding.  Patient expressed understanding of the importance of proper follow up care.  ? ?This document serves as a record of services personally performed by Gardiner Sleeper, MD, PhD. It was created on their behalf by Roselee Nova, COMT. The creation of this record is  the provider's dictation and/or activities during the visit. ? ?Electronically signed by: Roselee Nova, COMT 02/14/22 1:44 PM ? ?This document serves as a record of services personally performed by Aggie Moats

## 2022-02-14 ENCOUNTER — Other Ambulatory Visit: Payer: Self-pay

## 2022-02-14 ENCOUNTER — Ambulatory Visit (INDEPENDENT_AMBULATORY_CARE_PROVIDER_SITE_OTHER): Payer: Medicare PPO | Admitting: Ophthalmology

## 2022-02-14 ENCOUNTER — Encounter (INDEPENDENT_AMBULATORY_CARE_PROVIDER_SITE_OTHER): Payer: Self-pay | Admitting: Ophthalmology

## 2022-02-14 DIAGNOSIS — Z961 Presence of intraocular lens: Secondary | ICD-10-CM | POA: Diagnosis not present

## 2022-02-14 DIAGNOSIS — H35372 Puckering of macula, left eye: Secondary | ICD-10-CM

## 2022-02-14 DIAGNOSIS — H35412 Lattice degeneration of retina, left eye: Secondary | ICD-10-CM

## 2022-02-14 DIAGNOSIS — H33322 Round hole, left eye: Secondary | ICD-10-CM | POA: Diagnosis not present

## 2022-03-21 ENCOUNTER — Ambulatory Visit
Admission: RE | Admit: 2022-03-21 | Discharge: 2022-03-21 | Disposition: A | Discharge status: Home / Self Care | Payer: Medicare PPO | Source: Ambulatory Visit | Attending: Endocrinology | Admitting: Endocrinology

## 2022-03-21 DIAGNOSIS — Z1231 Encounter for screening mammogram for malignant neoplasm of breast: Secondary | ICD-10-CM

## 2022-06-02 NOTE — Progress Notes (Signed)
[facility] Note  06/14/2022    CHIEF COMPLAINT Patient presents for Retina Follow Up  HISTORY OF PRESENT ILLNESS: Heather Patton is a 76 y.o. female who presents to the clinic today for:   HPI     Retina Follow Up   Patient presents with  Other.  In left eye.  Duration of 4 months.  Since onset it is stable.  I, the attending physician,  performed the HPI with the patient and updated documentation appropriately.        Comments   4 month follow up lattice degeneration OS- Doing well, no new problems.       Last edited by Bernarda Caffey, MD on 06/14/2022  1:35 PM.    Patient does not see wavy/blurry lines when reading.   Referring physician: Luberta Mutter, MD [place] [address]  HISTORICAL INFORMATION:   Selected notes from the MEDICAL RECORD NUMBER Referred by Dr. Ellie Lunch Ocular Hx- Retinal Hole OS    CURRENT MEDICATIONS: No current outpatient medications on file. (Ophthalmic Drugs)   No current facility-administered medications for this visit. (Ophthalmic Drugs)   Current Outpatient Medications (Other)  Medication Sig   aspirin EC 81 MG tablet Take 81 mg by mouth daily.   calcium citrate-vitamin D (CITRACAL+D) 315-200 MG-UNIT per tablet Take 1 tablet by mouth daily. Calcium citrate '630mg'$     Vitamin D  500iu    ezetimibe-simvastatin (VYTORIN) 10-80 MG per tablet Take 1 tablet by mouth at bedtime.     levothyroxine (SYNTHROID, LEVOTHROID) 150 MCG tablet Take 150 mcg by mouth daily.     docusate sodium (COLACE) 100 MG capsule Take 100 mg by mouth daily.  (Patient not taking: Reported on 01/11/2022)   letrozole (FEMARA) 2.5 MG tablet Take 1 tablet (2.5 mg total) by mouth daily. (Patient not taking: Reported on 01/11/2022)   No current facility-administered medications for this visit. (Other)   REVIEW OF SYSTEMS: ROS   Positive for: Musculoskeletal, Endocrine, Eyes Negative for: Constitutional, Gastrointestinal,  Neurological, Skin, Genitourinary, HENT, Cardiovascular, Respiratory, Psychiatric, Allergic/Imm, Heme/Lymph Last edited by Leonie Douglas, COA on 06/14/2022  1:09 PM.     ALLERGIES No Known Allergies  PAST MEDICAL HISTORY Past Medical History:  Diagnosis Date   Breast cancer ([place]) 2013   Left   Degenerative joint disease    GERD (gastroesophageal reflux disease)    Hx of colonic polyps    Hyperlipidemia    Hypothyroidism    Osteopenia    Personal history of radiation therapy    April-May 2013    Radiation 02/28/12  6300 cGy   left breast   Sleep apnea    Past Surgical History:  Procedure Laterality Date   ABDOMINAL HYSTERECTOMY     BREAST BIOPSY  12/05/2011   left breast   BREAST LUMPECTOMY Left 2013   BUNIONECTOMY  1985   lt   CATARACT EXTRACTION     CERVICAL DISC SURGERY     COLONOSCOPY     TONSILLECTOMY     FAMILY HISTORY Family History  Problem Relation Age of Onset   Cancer Mother        breast   Breast cancer Mother 2   Heart disease Father    Cancer Cousin        breast   Breast cancer Cousin    SOCIAL HISTORY Social History   Tobacco Use   Smoking status: Former    Packs/day: 0.50  Types: Cigarettes    Quit date: 11/21/1978    Years since quitting: 43.5   Smokeless tobacco: Never  Vaping Use   Vaping Use: Never used  Substance Use Topics   Alcohol use: Yes    Alcohol/week: 6.0 standard drinks of alcohol    Types: 6 Glasses of wine per week   Drug use: No       OPHTHALMIC EXAM:  Base Eye Exam     Visual Acuity (Snellen - Linear)       Right Left   Dist [place]  20/20   Near  J1+   Monovision with pseudophakia. OD near, OS distance         Tonometry (Tonopen, 1:13 PM)       Right Left   Pressure 19 18         Pupils       Dark Light Shape React APD   Right 2 1 Round Minimal None   Left 2 1 Round Minimal None         Visual Fields (Counting fingers)       Left Right    Full Full         Extraocular Movement        Right Left    Full Full         Neuro/Psych     Oriented x3: Yes   Mood/Affect: Normal         Dilation     Both eyes: 1.0% Mydriacyl, 2.5% Phenylephrine @ 1:13 PM           Slit Lamp and Fundus Exam     External Exam       Right Left   External Normal Normal         Slit Lamp Exam       Right Left   Lids/Lashes Dermatochalasis - upper lid, Meibomian gland dysfunction Dermatochalasis - upper lid, Meibomian gland dysfunction   Conjunctiva/Sclera nasal pingueculum White and quiet   Cornea Well healed temporal cataract wound, fine endo pigment Mild Punctate epithelial erosions, mild tear film debris, Well healed temporal cataract wound, dine endo pigment   Anterior Chamber Deep and quiet Deep and quiet   Iris Round and dilated Round and dilated   Lens PC IOL in good position PC IOL in good position   Anterior Vitreous Vitreous syneresis Vitreous syneresis         Fundus Exam       Right Left   Disc Pink and sharp, mild PPP Pink and sharp, mild temporal PPA/PPP   C/D Ratio 0.6 0.4   Macula Flat, Good foveal reflex, RPE mottling, No heme or edema Blunted foveal reflex, mild ERM with striae, no heme   Vessels attenuated, mild tortuosity attenuated, mild tortuosity   Periphery Attached, No heme, no RT/RD, mild Reticular degeneration Pigmented lattice with atrophic hole at 1230 -- vitreous condensation overlying -- with good laser surrounding, mild Reticular degeneration           IMAGING AND PROCEDURES  Imaging and Procedures for 06/14/2022  OCT, Retina - OU - Both Eyes       Right Eye Quality was good. Central Foveal Thickness: 278. Progression has been stable. Findings include normal foveal contour, no IRF, no SRF.   Left Eye Quality was good. Central Foveal Thickness: 397. Progression has been stable. Findings include no IRF, no SRF, abnormal foveal contour, epiretinal membrane, macular pucker (ERM with pucker, focal retinal thickening superior  fovea, trace  cystic changes stably improved).   Notes *Images captured and stored on drive  Diagnosis / Impression:  OD: NFP; no IRF/SRF  OS: ERM with pucker, focal retinal thickening superior fovea, trace cystic changes stably improved  Clinical management:  See below  Abbreviations: NFP - Normal foveal profile. CME - cystoid macular edema. PED - pigment epithelial detachment. IRF - intraretinal fluid. SRF - subretinal fluid. EZ - ellipsoid zone. ERM - epiretinal membrane. ORA - outer retinal atrophy. ORT - outer retinal tubulation. SRHM - subretinal hyper-reflective material. 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 Retinal hole of left eye  H33.322     3. Epiretinal membrane (ERM) of left eye  H35.372 OCT, Retina - OU - Both Eyes    4. Pseudophakia of both eyes  Z96.1      1,2. Lattice degeneration w/ atrophic holes, OS - Pigmented lattice with atrophic hole at 1230, vitreous condensation overlying - s/p laser retinopexy OS (02.27.23) -- good laser surrounding - completed Lotemax QID OS x5-7 days - no new RT/RD or lattice - f/u 4 -6 months, DFE, OCT  3. Epiretinal membrane, left eye  - OCT shows: ERM with pucker, focal retinal thickening superior fovea, trace cystic changes stably improved - BCVA 20/20 - asymptomatic, no metamorphopsia - no indication for surgery at this time - continue to monitor - f/u 4-6 months DFE, OCT  4. Pseudophakia OU  - s/p CE/IOL OU by expert surgeon, Dr. Ellie Lunch  - IOLs in perfect position, doing well  - continue to monitor  Ophthalmic Meds Ordered this visit:  No orders of the defined types were placed in this encounter.    Return in about 5 months (around 11/14/2022) for 4-6 Months Lattice [place], DFE, OCT.  There are no Patient Instructions on file for this visit.   Explained the diagnoses, plan,  and follow up with the patient and they expressed understanding.  Patient expressed understanding of the importance of proper follow up care.   This document serves as a record of services personally performed by Gardiner Sleeper, MD, PhD. It was created on their behalf by Renaldo Reel, [place] an ophthalmic technician. The creation of this record is the provider's dictation and/or activities during the visit.    Electronically signed by:  Renaldo Reel, COT  06/02/22 1:01 AM   Gardiner Sleeper, M.D., Ph.D. Diseases & Surgery of the Retina and Vitreous [facility]  I have reviewed the above documentation for accuracy and completeness, and I agree with the above. Gardiner Sleeper, M.D., Ph.D. 06/15/22 1:02 AM   Abbreviations: M myopia (nearsighted); A astigmatism; H hyperopia (farsighted); P presbyopia; Mrx spectacle prescription;  CTL contact lenses; OD right eye; OS left eye; OU both eyes  XT exotropia; ET esotropia; PEK punctate epithelial keratitis; PEE punctate epithelial erosions; DES dry eye syndrome; MGD meibomian gland dysfunction; ATs artificial tears; PFAT's preservative free artificial tears; [place] nuclear sclerotic cataract; PSC posterior subcapsular cataract; ERM epi-retinal membrane; PVD posterior vitreous detachment; RD retinal detachment; DM diabetes mellitus; DR diabetic retinopathy; NPDR non-proliferative diabetic retinopathy; PDR proliferative diabetic retinopathy; CSME clinically significant macular edema; DME diabetic macular edema; dbh dot blot hemorrhages; CWS cotton wool spot; POAG primary open angle glaucoma; C/D cup-to-disc ratio; HVF humphrey visual field; GVF goldmann visual  field; OCT optical coherence tomography; IOP intraocular pressure; BRVO Branch retinal vein occlusion; CRVO central retinal vein occlusion; CRAO central retinal artery occlusion; BRAO branch retinal artery occlusion; RT retinal tear; SB scleral buckle; PPV pars plana vitrectomy;  VH Vitreous hemorrhage; PRP panretinal laser photocoagulation; IVK intravitreal kenalog; VMT vitreomacular traction; MH Macular hole;  NVD neovascularization of the disc; NVE neovascularization elsewhere; AREDS age related eye disease study; ARMD age related macular degeneration; POAG primary open angle glaucoma; EBMD epithelial/anterior basement membrane dystrophy; ACIOL anterior chamber intraocular lens; IOL intraocular lens; PCIOL posterior chamber intraocular lens; Phaco/IOL phacoemulsification with intraocular lens placement; [place] photorefractive keratectomy; LASIK laser assisted in situ keratomileusis; HTN hypertension; DM diabetes mellitus; COPD chronic obstructive pulmonary disease

## 2022-06-14 ENCOUNTER — Ambulatory Visit (INDEPENDENT_AMBULATORY_CARE_PROVIDER_SITE_OTHER): Payer: Medicare PPO | Admitting: Ophthalmology

## 2022-06-14 ENCOUNTER — Encounter (INDEPENDENT_AMBULATORY_CARE_PROVIDER_SITE_OTHER): Payer: Self-pay | Admitting: Ophthalmology

## 2022-06-14 DIAGNOSIS — Z961 Presence of intraocular lens: Secondary | ICD-10-CM | POA: Diagnosis not present

## 2022-06-14 DIAGNOSIS — H35372 Puckering of macula, left eye: Secondary | ICD-10-CM

## 2022-06-14 DIAGNOSIS — H35412 Lattice degeneration of retina, left eye: Secondary | ICD-10-CM | POA: Diagnosis not present

## 2022-06-14 DIAGNOSIS — H33322 Round hole, left eye: Secondary | ICD-10-CM

## 2022-09-03 DIAGNOSIS — Z23 Encounter for immunization: Secondary | ICD-10-CM | POA: Diagnosis not present

## 2022-10-20 DIAGNOSIS — K635 Polyp of colon: Secondary | ICD-10-CM | POA: Diagnosis not present

## 2022-10-20 DIAGNOSIS — R7301 Impaired fasting glucose: Secondary | ICD-10-CM | POA: Diagnosis not present

## 2022-10-20 DIAGNOSIS — K219 Gastro-esophageal reflux disease without esophagitis: Secondary | ICD-10-CM | POA: Diagnosis not present

## 2022-10-20 DIAGNOSIS — E785 Hyperlipidemia, unspecified: Secondary | ICD-10-CM | POA: Diagnosis not present

## 2022-10-20 DIAGNOSIS — M5416 Radiculopathy, lumbar region: Secondary | ICD-10-CM | POA: Diagnosis not present

## 2022-10-20 DIAGNOSIS — G4730 Sleep apnea, unspecified: Secondary | ICD-10-CM | POA: Diagnosis not present

## 2022-10-20 DIAGNOSIS — E039 Hypothyroidism, unspecified: Secondary | ICD-10-CM | POA: Diagnosis not present

## 2022-10-20 DIAGNOSIS — M8580 Other specified disorders of bone density and structure, unspecified site: Secondary | ICD-10-CM | POA: Diagnosis not present

## 2022-10-24 ENCOUNTER — Encounter: Payer: Self-pay | Admitting: Endocrinology

## 2022-10-24 ENCOUNTER — Other Ambulatory Visit: Payer: Self-pay | Admitting: Endocrinology

## 2022-10-24 DIAGNOSIS — R7301 Impaired fasting glucose: Secondary | ICD-10-CM

## 2022-11-29 ENCOUNTER — Other Ambulatory Visit: Payer: Medicare PPO

## 2022-12-13 NOTE — Progress Notes (Signed)
[facility] Note  12/27/2022    CHIEF COMPLAINT Patient presents for Retina Follow Up  HISTORY OF PRESENT ILLNESS: Heather Patton is a 77 y.o. female who presents to the clinic today for:   HPI     Retina Follow Up   In left eye.  This started 5 months ago.  Duration of 5 months.  Since onset it is stable.  I, the attending physician,  performed the HPI with the patient and updated documentation appropriately.        Comments   5 month retina follow up lattice pt states no vision change noticed she denies any flashes or floaters       Last edited by Bernarda Caffey, MD on 12/27/2022  5:39 PM.     Patient states vision in the left eye might not be as good, she hasn't noticed any new distortion  Referring physician: Luberta Mutter, MD [place] [id number]  HISTORICAL INFORMATION:   Selected notes from the MEDICAL RECORD NUMBER Referred by Dr. Ellie Lunch Ocular Hx- Retinal Hole OS    CURRENT MEDICATIONS: No current outpatient medications on file. (Ophthalmic Drugs)   No current facility-administered medications for this visit. (Ophthalmic Drugs)   Current Outpatient Medications (Other)  Medication Sig   aspirin EC 81 MG tablet Take 81 mg by mouth daily.   calcium citrate-vitamin D (CITRACAL+D) 315-200 MG-UNIT per tablet Take 1 tablet by mouth daily. Calcium citrate '630mg'$     Vitamin D  500iu    docusate sodium (COLACE) 100 MG capsule Take 100 mg by mouth daily.  (Patient not taking: Reported on 01/11/2022)   ezetimibe-simvastatin (VYTORIN) 10-80 MG per tablet Take 1 tablet by mouth at bedtime.     letrozole (FEMARA) 2.5 MG tablet Take 1 tablet (2.5 mg total) by mouth daily. (Patient not taking: Reported on 01/11/2022)   levothyroxine (SYNTHROID, LEVOTHROID) 150 MCG tablet Take 150 mcg by mouth daily.     No current facility-administered medications for this visit. (Other)   REVIEW OF SYSTEMS: ROS   Positive for:  Musculoskeletal, Endocrine, Eyes Negative for: Constitutional, Gastrointestinal, Neurological, Skin, Genitourinary, HENT, Cardiovascular, Respiratory, Psychiatric, Allergic/Imm, Heme/Lymph Last edited by Parthenia Ames, COT on 12/27/2022  1:08 PM.      ALLERGIES No Known Allergies  PAST MEDICAL HISTORY Past Medical History:  Diagnosis Date   Breast cancer ([place]) 2013   Left   Degenerative joint disease    GERD (gastroesophageal reflux disease)    Hx of colonic polyps    Hyperlipidemia    Hypothyroidism    Osteopenia    Personal history of radiation therapy    April-May 2013    Radiation 02/28/12  6300 cGy   left breast   Sleep apnea    Past Surgical History:  Procedure Laterality Date   ABDOMINAL HYSTERECTOMY     BREAST BIOPSY  12/05/2011   left breast   BREAST LUMPECTOMY Left 2013   BUNIONECTOMY  1985   lt   CATARACT EXTRACTION     CERVICAL DISC SURGERY     COLONOSCOPY     TONSILLECTOMY     FAMILY HISTORY Family History  Problem Relation Age of Onset   Cancer Mother        breast   Breast cancer Mother 72   Heart disease Father    Cancer Cousin        breast   Breast cancer Cousin    SOCIAL HISTORY Social  History   Tobacco Use   Smoking status: Former    Packs/day: 0.50    Types: Cigarettes    Quit date: 11/21/1978    Years since quitting: 44.1   Smokeless tobacco: Never  Vaping Use   Vaping Use: Never used  Substance Use Topics   Alcohol use: Yes    Alcohol/week: 6.0 standard drinks of alcohol    Types: 6 Glasses of wine per week   Drug use: No       OPHTHALMIC EXAM:  Base Eye Exam     Visual Acuity (Snellen - Linear)       Right Left   Dist [place]  20/20 -1   Near [place] J1   OD near OS far         Tonometry (Tonopen, 1:11 PM)       Right Left   Pressure 15 13         Pupils       Pupils Dark Light Shape React APD   Right PERRL 2 1 Round Brisk None   Left PERRL 2 1 Round Brisk None         Visual Fields       Left  Right    Full Full         Extraocular Movement       Right Left    Full, Ortho Full, Ortho         Neuro/Psych     Oriented x3: Yes   Mood/Affect: Normal         Dilation     Both eyes: 2.5% Phenylephrine @ 1:11 PM           Slit Lamp and Fundus Exam     External Exam       Right Left   External Normal Normal         Slit Lamp Exam       Right Left   Lids/Lashes Dermatochalasis - upper lid, Meibomian gland dysfunction Dermatochalasis - upper lid, Meibomian gland dysfunction   Conjunctiva/Sclera nasal pingueculum White and quiet   Cornea Well healed temporal cataract wound, fine endo pigment Mild Punctate epithelial erosions, mild tear film debris, Well healed temporal cataract wound, fine endo pigment   Anterior Chamber deep, clear, narrow temporal angle deep, clear, narrow temporal angle   Iris Round and dilated Round and dilated   Lens PC IOL in good position PC IOL in good position, 1+ Posterior capsular opacification superiorly   Anterior Vitreous Vitreous syneresis Vitreous syneresis, Posterior vitreous detachment         Fundus Exam       Right Left   Disc Pink and sharp, mild PPP Pink and sharp, mild temporal PPA/PPP   C/D Ratio 0.6 0.4   Macula Flat, Good foveal reflex, RPE mottling, No heme or edema Blunted foveal reflex, mild ERM with mild striae and retinal thickening superior macula, no heme   Vessels attenuated, mild tortuosity attenuated, mild tortuosity   Periphery Attached, No heme, no RT/RD, mild Reticular degeneration Pigmented lattice with atrophic hole at 1230 -- vitreous condensation overlying -- with good laser surrounding, mild Reticular degeneration           IMAGING AND PROCEDURES  Imaging and Procedures for 12/27/2022  OCT, Retina - OU - Both Eyes       Right Eye Quality was good. Central Foveal Thickness: 278. Progression has been stable. Findings include normal foveal contour, no IRF, no SRF.   Left  Eye Quality  was good. Central Foveal Thickness: 413. Progression has been stable. Findings include no IRF, no SRF, abnormal foveal contour, epiretinal membrane, macular pucker (ERM with pucker, focal retinal thickening superior fovea, trace cystic changes stably improved).   Notes *Images captured and stored on drive  Diagnosis / Impression:  OD: NFP; no IRF/SRF  OS: ERM with pucker, focal retinal thickening superior fovea, trace cystic changes stably improved  Clinical management:  See below  Abbreviations: NFP - Normal foveal profile. CME - cystoid macular edema. PED - pigment epithelial detachment. IRF - intraretinal fluid. SRF - subretinal fluid. EZ - ellipsoid zone. ERM - epiretinal membrane. ORA - outer retinal atrophy. ORT - outer retinal tubulation. SRHM - subretinal hyper-reflective material. 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 Retinal hole of left eye  H33.322     3. Epiretinal membrane (ERM) of left eye  H35.372 OCT, Retina - OU - Both Eyes    4. Pseudophakia of both eyes  Z96.1       1,2. Lattice degeneration w/ atrophic holes, OS - Pigmented lattice with atrophic hole at 1230, vitreous condensation overlying - s/p laser retinopexy OS (02.27.23) -- good laser surrounding - no new RT/RD or lattice - f/u 6-9 months, DFE, OCT  3. Epiretinal membrane, left eye  - OCT shows: ERM with pucker, focal retinal thickening superior fovea, trace cystic changes stably improved - BCVA 20/20 - asymptomatic, no metamorphopsia - no indication for surgery at this time - continue to monitor - f/u 6-9 months DFE, OCT  4. Pseudophakia OU  - s/p CE/IOL OU by expert surgeon, Dr. Ellie Lunch  - IOLs in perfect position, doing well  - continue to monitor  Ophthalmic Meds Ordered this visit:  No orders of the defined types were placed in this encounter.    Return for f/u 6-9 months, ERM  OS, DFE, OCT.  There are no Patient Instructions on file for this visit.   Explained the diagnoses, plan, and follow up with the patient and they expressed understanding.  Patient expressed understanding of the importance of proper follow up care.   This document serves as a record of services personally performed by Gardiner Sleeper, MD, PhD. It was created on their behalf by Renaldo Reel, [place] an ophthalmic technician. The creation of this record is the provider's dictation and/or activities during the visit.    Electronically signed by:  Renaldo Reel, COT  01.23.24 11:08 PM  This document serves as a record of services personally performed by Gardiner Sleeper, MD, PhD. It was created on their behalf by San Jetty. Owens Shark, [place] an ophthalmic technician. The creation of this record is the provider's dictation and/or activities during the visit.    Electronically signed by: San Jetty. Owens Shark, [place] 02.06.2024 11:08 PM  Gardiner Sleeper, M.D., Ph.D. Diseases & Surgery of the Retina and Vitreous [facility]  I have reviewed the above documentation for accuracy and completeness, and I agree with the above. Gardiner Sleeper, M.D., Ph.D. 12/27/22 11:18 PM  Abbreviations: M myopia (nearsighted); A astigmatism; H hyperopia (farsighted); P presbyopia; Mrx spectacle prescription;  CTL contact lenses; OD right eye; OS left eye; OU both eyes  XT exotropia; ET esotropia; PEK punctate epithelial keratitis; PEE punctate epithelial erosions; DES dry eye syndrome; MGD meibomian gland  dysfunction; ATs artificial tears; PFAT's preservative free artificial tears; [place] nuclear sclerotic cataract; PSC posterior subcapsular cataract; ERM epi-retinal membrane; PVD posterior vitreous detachment; RD retinal detachment; DM diabetes mellitus; DR diabetic retinopathy; NPDR non-proliferative diabetic retinopathy; PDR proliferative diabetic retinopathy; CSME clinically significant macular edema; DME  diabetic macular edema; dbh dot blot hemorrhages; CWS cotton wool spot; POAG primary open angle glaucoma; C/D cup-to-disc ratio; HVF humphrey visual field; GVF goldmann visual field; OCT optical coherence tomography; IOP intraocular pressure; BRVO Branch retinal vein occlusion; CRVO central retinal vein occlusion; CRAO central retinal artery occlusion; BRAO branch retinal artery occlusion; RT retinal tear; SB scleral buckle; PPV pars plana vitrectomy; VH Vitreous hemorrhage; PRP panretinal laser photocoagulation; IVK intravitreal kenalog; VMT vitreomacular traction; MH Macular hole;  NVD neovascularization of the disc; NVE neovascularization elsewhere; AREDS age related eye disease study; ARMD age related macular degeneration; POAG primary open angle glaucoma; EBMD epithelial/anterior basement membrane dystrophy; ACIOL anterior chamber intraocular lens; IOL intraocular lens; PCIOL posterior chamber intraocular lens; Phaco/IOL phacoemulsification with intraocular lens placement; [place] photorefractive keratectomy; LASIK laser assisted in situ keratomileusis; HTN hypertension; DM diabetes mellitus; COPD chronic obstructive pulmonary disease

## 2022-12-23 ENCOUNTER — Ambulatory Visit
Admission: RE | Admit: 2022-12-23 | Discharge: 2022-12-23 | Disposition: A | Discharge status: Home / Self Care | Payer: No Typology Code available for payment source | Source: Ambulatory Visit | Attending: Endocrinology | Admitting: Endocrinology

## 2022-12-23 DIAGNOSIS — R7301 Impaired fasting glucose: Secondary | ICD-10-CM

## 2022-12-27 ENCOUNTER — Ambulatory Visit (INDEPENDENT_AMBULATORY_CARE_PROVIDER_SITE_OTHER): Payer: Medicare PPO | Admitting: Ophthalmology

## 2022-12-27 ENCOUNTER — Encounter (INDEPENDENT_AMBULATORY_CARE_PROVIDER_SITE_OTHER): Payer: Self-pay | Admitting: Ophthalmology

## 2022-12-27 DIAGNOSIS — H33322 Round hole, left eye: Secondary | ICD-10-CM | POA: Diagnosis not present

## 2022-12-27 DIAGNOSIS — H35372 Puckering of macula, left eye: Secondary | ICD-10-CM | POA: Diagnosis not present

## 2022-12-27 DIAGNOSIS — H35412 Lattice degeneration of retina, left eye: Secondary | ICD-10-CM

## 2022-12-27 DIAGNOSIS — Z961 Presence of intraocular lens: Secondary | ICD-10-CM

## 2022-12-29 DIAGNOSIS — D225 Melanocytic nevi of trunk: Secondary | ICD-10-CM | POA: Diagnosis not present

## 2022-12-29 DIAGNOSIS — L814 Other melanin hyperpigmentation: Secondary | ICD-10-CM | POA: Diagnosis not present

## 2022-12-29 DIAGNOSIS — L821 Other seborrheic keratosis: Secondary | ICD-10-CM | POA: Diagnosis not present

## 2022-12-29 DIAGNOSIS — L918 Other hypertrophic disorders of the skin: Secondary | ICD-10-CM | POA: Diagnosis not present

## 2022-12-29 DIAGNOSIS — D2261 Melanocytic nevi of right upper limb, including shoulder: Secondary | ICD-10-CM | POA: Diagnosis not present

## 2022-12-29 DIAGNOSIS — L738 Other specified follicular disorders: Secondary | ICD-10-CM | POA: Diagnosis not present

## 2022-12-29 DIAGNOSIS — Z85828 Personal history of other malignant neoplasm of skin: Secondary | ICD-10-CM | POA: Diagnosis not present

## 2023-01-03 ENCOUNTER — Other Ambulatory Visit: Payer: Self-pay | Admitting: Endocrinology

## 2023-01-03 DIAGNOSIS — R9389 Abnormal findings on diagnostic imaging of other specified body structures: Secondary | ICD-10-CM

## 2023-01-13 ENCOUNTER — Ambulatory Visit: Payer: Medicare PPO

## 2023-01-13 ENCOUNTER — Ambulatory Visit
Admission: RE | Admit: 2023-01-13 | Discharge: 2023-01-13 | Disposition: A | Discharge status: Home / Self Care | Payer: Medicare PPO | Source: Ambulatory Visit | Attending: Endocrinology | Admitting: Endocrinology

## 2023-01-13 DIAGNOSIS — R9389 Abnormal findings on diagnostic imaging of other specified body structures: Secondary | ICD-10-CM

## 2023-01-18 DIAGNOSIS — H35412 Lattice degeneration of retina, left eye: Secondary | ICD-10-CM | POA: Diagnosis not present

## 2023-01-18 DIAGNOSIS — H26492 Other secondary cataract, left eye: Secondary | ICD-10-CM | POA: Diagnosis not present

## 2023-01-18 DIAGNOSIS — H35372 Puckering of macula, left eye: Secondary | ICD-10-CM | POA: Diagnosis not present

## 2023-01-18 DIAGNOSIS — Z961 Presence of intraocular lens: Secondary | ICD-10-CM | POA: Diagnosis not present

## 2023-01-18 DIAGNOSIS — H5211 Myopia, right eye: Secondary | ICD-10-CM | POA: Diagnosis not present

## 2023-01-27 DIAGNOSIS — H6123 Impacted cerumen, bilateral: Secondary | ICD-10-CM | POA: Diagnosis not present

## 2023-01-27 DIAGNOSIS — E039 Hypothyroidism, unspecified: Secondary | ICD-10-CM | POA: Diagnosis not present

## 2023-01-27 DIAGNOSIS — Z0130 Encounter for examination of blood pressure without abnormal findings: Secondary | ICD-10-CM | POA: Diagnosis not present

## 2023-01-27 DIAGNOSIS — Z6838 Body mass index (BMI) 38.0-38.9, adult: Secondary | ICD-10-CM | POA: Diagnosis not present

## 2023-01-27 DIAGNOSIS — E669 Obesity, unspecified: Secondary | ICD-10-CM | POA: Diagnosis not present

## 2023-01-27 DIAGNOSIS — H9203 Otalgia, bilateral: Secondary | ICD-10-CM | POA: Diagnosis not present

## 2023-02-15 ENCOUNTER — Other Ambulatory Visit: Payer: Self-pay | Admitting: Endocrinology

## 2023-02-15 DIAGNOSIS — Z1231 Encounter for screening mammogram for malignant neoplasm of breast: Secondary | ICD-10-CM

## 2023-03-23 ENCOUNTER — Encounter: Payer: Self-pay | Admitting: Podiatry

## 2023-03-23 ENCOUNTER — Ambulatory Visit: Payer: Medicare PPO | Admitting: Podiatry

## 2023-03-23 DIAGNOSIS — S90221A Contusion of right lesser toe(s) with damage to nail, initial encounter: Secondary | ICD-10-CM | POA: Diagnosis not present

## 2023-03-23 NOTE — Progress Notes (Signed)
Subjective:  Patient ID: Heather Patton, female    DOB: 1946/05/14,  MRN: [id number] HPI Chief Complaint  Patient presents with   Nail Problem    Hallux right - dark area base of toenail x couple months, not sore, no injury   New Patient (Initial Visit)    Est pt 2016    77 y.o. female presents with the above complaint.   ROS: Denies fever chills nausea vomit muscle aches pains calf pain back pain chest pain shortness of breath.  Past Medical History:  Diagnosis Date   Breast cancer (HCC) 2013   Left   Degenerative joint disease    GERD (gastroesophageal reflux disease)    Hx of colonic polyps    Hyperlipidemia    Hypothyroidism    Osteopenia    Personal history of radiation therapy    April-May 2013    Radiation 02/28/12  6300 cGy   left breast   Sleep apnea    Past Surgical History:  Procedure Laterality Date   ABDOMINAL HYSTERECTOMY     BREAST BIOPSY  12/05/2011   left breast   BREAST LUMPECTOMY Left 2013   BUNIONECTOMY  1985   lt   CATARACT EXTRACTION     CERVICAL DISC SURGERY     COLONOSCOPY     TONSILLECTOMY      Current Outpatient Medications:    aspirin EC 81 MG tablet, Take 81 mg by mouth daily., Disp: , Rfl:    calcium citrate-vitamin D (CITRACAL+D) 315-200 MG-UNIT per tablet, Take 1 tablet by mouth daily. Calcium citrate 630mg     Vitamin D  500iu , Disp: , Rfl:    ezetimibe-simvastatin (VYTORIN) 10-80 MG per tablet, Take 1 tablet by mouth at bedtime.  , Disp: , Rfl:    letrozole (FEMARA) 2.5 MG tablet, Take 1 tablet (2.5 mg total) by mouth daily. (Patient not taking: Reported on 01/11/2022), Disp: 90 tablet, Rfl: 3   levothyroxine (SYNTHROID, LEVOTHROID) 150 MCG tablet, Take 150 mcg by mouth daily.  , Disp: , Rfl:   Allergies  Allergen Reactions   Latex Rash   Review of Systems Objective:  There were no vitals filed for this visit.  General: Well developed, nourished, in no acute distress, alert and oriented x3   Dermatological: Skin is warm,  dry and supple bilateral. Nails x 10 are well maintained; remaining integument appears unremarkable at this time. There are no open sores, no preulcerative lesions, no rash or signs of infection present.  She does have a subungual hematoma to the hallux right most likely secondary to slightly elongated hallux nail does not demonstrate any type of onychomycosis or any type of infection.  But I do think the juxtaposition of the hallux with the hallux interphalangeal and the second toe most likely is resulting in her symptomatology.  She has a small area on the medial aspect of the hallux nail plate left foot but does not appear to be is widespread.  Neither of these appear to be anything other than a bruise.  Vascular: Dorsalis Pedis artery and Posterior Tibial artery pedal pulses are 2/4 bilateral with immedate capillary fill time. Pedal hair growth present. No varicosities and no lower extremity edema present bilateral.   Neruologic: Grossly intact via light touch bilateral. Vibratory intact via tuning fork bilateral. Protective threshold with Semmes Wienstein monofilament intact to all pedal sites bilateral. Patellar and Achilles deep tendon reflexes 2+ bilateral. No Babinski or clonus noted bilateral.   Musculoskeletal: No gross boney pedal deformities  bilateral. No pain, crepitus, or limitation noted with foot and ankle range of motion bilateral. Muscular strength 5/5 in all groups tested bilateral.  Gait: Unassisted, Nonantalgic.    Radiographs:  None taken  Assessment & Plan:   Assessment: Subungual hematoma hallux right over left  Plan: Discussed etiology pathology conservative surgical therapies recommended that she keep an eye on this and watch and make sure that it continues to grow out.  Should it not grow out but the nail grows she is to let me know so that we can do a biopsy.     Abiel Antrim T. [place], [place]

## 2023-04-05 ENCOUNTER — Ambulatory Visit
Admission: RE | Admit: 2023-04-05 | Discharge: 2023-04-05 | Disposition: A | Discharge status: Home / Self Care | Payer: Medicare PPO | Source: Ambulatory Visit | Attending: Endocrinology | Admitting: Endocrinology

## 2023-04-05 DIAGNOSIS — Z1231 Encounter for screening mammogram for malignant neoplasm of breast: Secondary | ICD-10-CM | POA: Diagnosis not present

## 2023-04-12 DIAGNOSIS — E039 Hypothyroidism, unspecified: Secondary | ICD-10-CM | POA: Diagnosis not present

## 2023-04-12 DIAGNOSIS — M8580 Other specified disorders of bone density and structure, unspecified site: Secondary | ICD-10-CM | POA: Diagnosis not present

## 2023-04-12 DIAGNOSIS — E119 Type 2 diabetes mellitus without complications: Secondary | ICD-10-CM | POA: Diagnosis not present

## 2023-04-12 DIAGNOSIS — R7301 Impaired fasting glucose: Secondary | ICD-10-CM | POA: Diagnosis not present

## 2023-04-12 DIAGNOSIS — K219 Gastro-esophageal reflux disease without esophagitis: Secondary | ICD-10-CM | POA: Diagnosis not present

## 2023-04-12 DIAGNOSIS — E785 Hyperlipidemia, unspecified: Secondary | ICD-10-CM | POA: Diagnosis not present

## 2023-04-19 DIAGNOSIS — Z0000 Encounter for general adult medical examination without abnormal findings: Secondary | ICD-10-CM | POA: Diagnosis not present

## 2023-04-19 DIAGNOSIS — I2584 Coronary atherosclerosis due to calcified coronary lesion: Secondary | ICD-10-CM | POA: Diagnosis not present

## 2023-04-19 DIAGNOSIS — E119 Type 2 diabetes mellitus without complications: Secondary | ICD-10-CM | POA: Diagnosis not present

## 2023-04-19 DIAGNOSIS — M5416 Radiculopathy, lumbar region: Secondary | ICD-10-CM | POA: Diagnosis not present

## 2023-04-19 DIAGNOSIS — E785 Hyperlipidemia, unspecified: Secondary | ICD-10-CM | POA: Diagnosis not present

## 2023-04-19 DIAGNOSIS — H35412 Lattice degeneration of retina, left eye: Secondary | ICD-10-CM | POA: Diagnosis not present

## 2023-04-19 DIAGNOSIS — Z1339 Encounter for screening examination for other mental health and behavioral disorders: Secondary | ICD-10-CM | POA: Diagnosis not present

## 2023-04-19 DIAGNOSIS — R82998 Other abnormal findings in urine: Secondary | ICD-10-CM | POA: Diagnosis not present

## 2023-04-19 DIAGNOSIS — Z853 Personal history of malignant neoplasm of breast: Secondary | ICD-10-CM | POA: Diagnosis not present

## 2023-04-19 DIAGNOSIS — Z1331 Encounter for screening for depression: Secondary | ICD-10-CM | POA: Diagnosis not present

## 2023-07-13 DIAGNOSIS — E119 Type 2 diabetes mellitus without complications: Secondary | ICD-10-CM | POA: Diagnosis not present

## 2023-07-13 DIAGNOSIS — G4730 Sleep apnea, unspecified: Secondary | ICD-10-CM | POA: Diagnosis not present

## 2023-07-13 DIAGNOSIS — I2584 Coronary atherosclerosis due to calcified coronary lesion: Secondary | ICD-10-CM | POA: Diagnosis not present

## 2023-08-24 DIAGNOSIS — Z23 Encounter for immunization: Secondary | ICD-10-CM | POA: Diagnosis not present

## 2023-08-24 DIAGNOSIS — Z853 Personal history of malignant neoplasm of breast: Secondary | ICD-10-CM | POA: Diagnosis not present

## 2023-08-24 DIAGNOSIS — H35412 Lattice degeneration of retina, left eye: Secondary | ICD-10-CM | POA: Diagnosis not present

## 2023-08-24 DIAGNOSIS — M5416 Radiculopathy, lumbar region: Secondary | ICD-10-CM | POA: Diagnosis not present

## 2023-08-24 DIAGNOSIS — E119 Type 2 diabetes mellitus without complications: Secondary | ICD-10-CM | POA: Diagnosis not present

## 2023-08-24 DIAGNOSIS — I2584 Coronary atherosclerosis due to calcified coronary lesion: Secondary | ICD-10-CM | POA: Diagnosis not present

## 2023-08-24 DIAGNOSIS — E785 Hyperlipidemia, unspecified: Secondary | ICD-10-CM | POA: Diagnosis not present

## 2023-09-04 DIAGNOSIS — Z860100 Personal history of colon polyps, unspecified: Secondary | ICD-10-CM | POA: Diagnosis not present

## 2023-09-04 DIAGNOSIS — D124 Benign neoplasm of descending colon: Secondary | ICD-10-CM | POA: Diagnosis not present

## 2023-09-04 DIAGNOSIS — K5730 Diverticulosis of large intestine without perforation or abscess without bleeding: Secondary | ICD-10-CM | POA: Diagnosis not present

## 2023-09-04 DIAGNOSIS — Z09 Encounter for follow-up examination after completed treatment for conditions other than malignant neoplasm: Secondary | ICD-10-CM | POA: Diagnosis not present

## 2023-09-04 DIAGNOSIS — D123 Benign neoplasm of transverse colon: Secondary | ICD-10-CM | POA: Diagnosis not present

## 2023-09-26 ENCOUNTER — Encounter (INDEPENDENT_AMBULATORY_CARE_PROVIDER_SITE_OTHER): Payer: Medicare PPO | Admitting: Ophthalmology

## 2023-09-28 NOTE — Progress Notes (Shared)
[facility] - Clinic Note  09/29/2023    CHIEF COMPLAINT Patient presents for Retina Follow Up  HISTORY OF PRESENT ILLNESS: Heather Patton is a 77 y.o. female who presents to the clinic today for:   HPI     Retina Follow Up   In left eye.  This started months ago.  Duration of 9 months.  Since onset it is stable.  I, the attending physician,  performed the HPI with the patient and updated documentation appropriately.        Comments   Patient feels the vision is stable. She is not using eye drops. She has been recently diagnosed as a diabetic. She started Ozempic. Her A1C is 7.4      Last edited by Rennis Chris, MD on 10/01/2023  1:25 AM.    Patient states no change in vision  Referring physician: Maris Berger, MD [address]  [place] [address]  HISTORICAL INFORMATION:   Selected notes from the MEDICAL RECORD NUMBER Referred by Dr. Charlotte Sanes Ocular Hx- Retinal Hole OS    CURRENT MEDICATIONS: No current outpatient medications on file. (Ophthalmic Drugs)   No current facility-administered medications for this visit. (Ophthalmic Drugs)   Current Outpatient Medications (Other)  Medication Sig   aspirin EC 81 MG tablet Take 81 mg by mouth daily.   calcium citrate-vitamin D (CITRACAL+D) 315-200 MG-UNIT per tablet Take 1 tablet by mouth daily. Calcium citrate 630mg     Vitamin D  500iu    ezetimibe-simvastatin (VYTORIN) 10-80 MG per tablet Take 1 tablet by mouth at bedtime.     letrozole (FEMARA) 2.5 MG tablet Take 1 tablet (2.5 mg total) by mouth daily.   levothyroxine (SYNTHROID, LEVOTHROID) 150 MCG tablet Take 150 mcg by mouth daily.     No current facility-administered medications for this visit. (Other)   REVIEW OF SYSTEMS: ROS   Positive for: Musculoskeletal, Endocrine, Eyes Negative for: Constitutional, Gastrointestinal, Neurological, Skin, Genitourinary, HENT, Cardiovascular, Respiratory, Psychiatric, Allergic/Imm,  Heme/Lymph Last edited by Charlette Caffey, COT on 09/29/2023 12:54 PM.       ALLERGIES Allergies  Allergen Reactions   Latex Rash    PAST MEDICAL HISTORY Past Medical History:  Diagnosis Date   Breast cancer (HCC) 2013   Left   Degenerative joint disease    GERD (gastroesophageal reflux disease)    Hx of colonic polyps    Hyperlipidemia    Hypothyroidism    Osteopenia    Personal history of radiation therapy    April-May 2013    Radiation 02/28/12  6300 cGy   left breast   Sleep apnea    Past Surgical History:  Procedure Laterality Date   ABDOMINAL HYSTERECTOMY     BREAST BIOPSY  12/05/2011   left breast   BREAST LUMPECTOMY Left 2013   BUNIONECTOMY  1985   lt   CATARACT EXTRACTION     CERVICAL DISC SURGERY     COLONOSCOPY     TONSILLECTOMY     FAMILY HISTORY Family History  Problem Relation Age of Onset   Cancer Mother        breast   Breast cancer Mother 41   Heart disease Father    Cancer Cousin        breast   Breast cancer Cousin    SOCIAL HISTORY Social History   Tobacco Use   Smoking status: Former    Current packs/day: 0.00    Types: Cigarettes    Quit date:  11/21/1978    Years since quitting: 44.8   Smokeless tobacco: Never  Vaping Use   Vaping status: Never Used  Substance Use Topics   Alcohol use: Yes    Alcohol/week: 6.0 standard drinks of alcohol    Types: 6 Glasses of wine per week   Drug use: No       OPHTHALMIC EXAM:  Base Eye Exam     Visual Acuity (Snellen - Linear)       Right Left   Dist [place]  20/20   [place] J1+          Tonometry (Tonopen, 12:58 PM)       Right Left   Pressure 18 17         Pupils       Dark Light Shape React APD   Right 2 1 Round Minimal None   Left 2 1 Round Minimal None         Visual Fields       Left Right    Full Full         Extraocular Movement       Right Left    Full, Ortho Full, Ortho         Neuro/Psych     Oriented x3: Yes   Mood/Affect: Normal          Dilation     Both eyes: 1.0% Mydriacyl, 2.5% Phenylephrine @ 12:56 PM           Slit Lamp and Fundus Exam     External Exam       Right Left   External Normal Normal         Slit Lamp Exam       Right Left   Lids/Lashes Dermatochalasis - upper lid, Meibomian gland dysfunction Dermatochalasis - upper lid, Meibomian gland dysfunction   Conjunctiva/Sclera nasal pingueculum White and quiet   Cornea Well healed temporal cataract wound, fine endo pigment, trace PEE Mild Punctate epithelial erosions, mild tear film debris, Well healed temporal cataract wound, fine endo pigment   Anterior Chamber deep, clear, narrow temporal angle deep, clear, narrow temporal angle   Iris Round and dilated Round and dilated   Lens PC IOL in good position PC IOL in good position, 1+ Posterior capsular opacification superiorly   Anterior Vitreous Vitreous syneresis Vitreous syneresis, Posterior vitreous detachment         Fundus Exam       Right Left   Disc Pink and sharp, mild PPP Pink and sharp, mild temporal PPA/PPP   C/D Ratio 0.6 0.4   Macula Flat, Good foveal reflex, RPE mottling, No heme or edema Blunted foveal reflex, mild ERM with mild striae and retinal thickening superior macula, no heme   Vessels attenuated, mild tortuosity attenuated, mild tortuosity   Periphery Attached, No heme, no RT/RD, mild Reticular degeneration Pigmented lattice with atrophic hole at 1230 -- vitreous condensation overlying -- with good laser surrounding, mild Reticular degeneration           IMAGING AND PROCEDURES  Imaging and Procedures for 09/29/2023  OCT, Retina - OU - Both Eyes       Right Eye Quality was good. Central Foveal Thickness: 274. Progression has been stable. Findings include normal foveal contour, no IRF, no SRF.   Left Eye Quality was good. Central Foveal Thickness: 408. Progression has improved. Findings include no IRF, no SRF, abnormal foveal contour, epiretinal membrane,  macular pucker (ERM with pucker, focal retinal  thickening superior fovea, no cystic changes, mild interval improvement in foveal contour).   Notes *Images captured and stored on drive  Diagnosis / Impression:  OD: NFP; no IRF/SRF  OS: ERM with pucker, focal retinal thickening superior fovea, no cystic changes, mild interval improvement in foveal contour  Clinical management:  See below  Abbreviations: NFP - Normal foveal profile. CME - cystoid macular edema. PED - pigment epithelial detachment. IRF - intraretinal fluid. SRF - subretinal fluid. EZ - ellipsoid zone. ERM - epiretinal membrane. ORA - outer retinal atrophy. ORT - outer retinal tubulation. SRHM - subretinal hyper-reflective material. IRHM - intraretinal hyper-reflective material            ASSESSMENT/PLAN:    ICD-10-CM   1. Epiretinal membrane (ERM) of left eye  H35.372 OCT, Retina - OU - Both Eyes    2. Diabetes mellitus type 2 without retinopathy (HCC)  E11.9     3. Long-term (current) use of injectable non-insulin antidiabetic drugs  Z79.85     4. Lattice degeneration of left retina  H35.412 OCT, Retina - OU - Both Eyes    5. Retinal hole of left eye  H33.322     6. Pseudophakia of both eyes  Z96.1      1. Epiretinal membrane, left eye  - OCT shows: ERM with pucker, focal retinal thickening superior fovea, no cystic changes, mild interval improvement in foveal contour - BCVA 20/20 - asymptomatic, no metamorphopsia - no indication for surgery at this time - continue to monitor - f/u in 9 months DFE, OCT  2,3. Diabetes mellitus, type 2 without retinopathy - The incidence, risk factors for progression, natural history and treatment options for diabetic retinopathy  were discussed with patient.   - The need for close monitoring of blood glucose, blood pressure, and serum lipids, avoiding cigarette or any type of tobacco, and the need for long term follow up was also discussed with patient. - f/u in 1 year,  sooner prn  4,5 . Lattice degeneration w/ atrophic holes, OS - Pigmented lattice with atrophic hole at 1230, vitreous condensation overlying - s/p laser retinopexy OS (02.27.23) -- good laser surrounding - no new RT/RD or lattice - f/u in 9 months, DFE, OCT  6. Pseudophakia OU  - s/p CE/IOL OU by expert surgeon, Dr. Charlotte Sanes  - IOLs in perfect position, doing well  - continue to monitor  Ophthalmic Meds Ordered this visit:  No orders of the defined types were placed in this encounter.    Return in about 9 months (around 06/28/2024) for f/u ERM OS, DFE, OCT.  There are no Patient Instructions on file for this visit.   This document serves as a record of services personally performed by Karie Chimera, MD, PhD. It was created on their behalf by Berlin Hun COT, an ophthalmic technician. The creation of this record is the provider's dictation and/or activities during the visit.    Electronically signed by: Berlin Hun COT 11.07.24 1:38 AM  This document serves as a record of services personally performed by Karie Chimera, MD, PhD. It was created on their behalf by Glee Arvin. Manson Passey, OA an ophthalmic technician. The creation of this record is the provider's dictation and/or activities during the visit.    Electronically signed by: Glee Arvin. Manson Passey, OA 10/01/23 1:38 AM  Karie Chimera, M.D., Ph.D. Diseases & Surgery of the Retina and Vitreous [facility] 09/29/2023   I have reviewed the above documentation for  accuracy and completeness, and I agree with the above. Karie Chimera, M.D., Ph.D. 10/01/23 1:43 AM   Abbreviations: M myopia (nearsighted); A astigmatism; H hyperopia (farsighted); P presbyopia; Mrx spectacle prescription;  CTL contact lenses; OD right eye; OS left eye; OU both eyes  XT exotropia; ET esotropia; PEK punctate epithelial keratitis; PEE punctate epithelial erosions; DES dry eye syndrome; MGD meibomian gland dysfunction; ATs  artificial tears; PFAT's preservative free artificial tears; NSC nuclear sclerotic cataract; PSC posterior subcapsular cataract; ERM epi-retinal membrane; PVD posterior vitreous detachment; RD retinal detachment; DM diabetes mellitus; DR diabetic retinopathy; NPDR non-proliferative diabetic retinopathy; PDR proliferative diabetic retinopathy; CSME clinically significant macular edema; DME diabetic macular edema; dbh dot blot hemorrhages; CWS cotton wool spot; POAG primary open angle glaucoma; C/D cup-to-disc ratio; HVF humphrey visual field; GVF goldmann visual field; OCT optical coherence tomography; IOP intraocular pressure; BRVO Branch retinal vein occlusion; CRVO central retinal vein occlusion; CRAO central retinal artery occlusion; BRAO branch retinal artery occlusion; RT retinal tear; SB scleral buckle; PPV pars plana vitrectomy; VH Vitreous hemorrhage; PRP panretinal laser photocoagulation; IVK intravitreal kenalog; VMT vitreomacular traction; MH Macular hole;  NVD neovascularization of the disc; NVE neovascularization elsewhere; AREDS age related eye disease study; ARMD age related macular degeneration; POAG primary open angle glaucoma; EBMD epithelial/anterior basement membrane dystrophy; ACIOL anterior chamber intraocular lens; IOL intraocular lens; PCIOL posterior chamber intraocular lens; Phaco/IOL phacoemulsification with intraocular lens placement; PRK photorefractive keratectomy; LASIK laser assisted in situ keratomileusis; HTN hypertension; DM diabetes mellitus; COPD chronic obstructive pulmonary disease

## 2023-09-29 ENCOUNTER — Ambulatory Visit (INDEPENDENT_AMBULATORY_CARE_PROVIDER_SITE_OTHER): Payer: Medicare PPO | Admitting: Ophthalmology

## 2023-09-29 ENCOUNTER — Encounter (INDEPENDENT_AMBULATORY_CARE_PROVIDER_SITE_OTHER): Payer: Self-pay | Admitting: Ophthalmology

## 2023-09-29 DIAGNOSIS — H35372 Puckering of macula, left eye: Secondary | ICD-10-CM

## 2023-09-29 DIAGNOSIS — Z7985 Long-term (current) use of injectable non-insulin antidiabetic drugs: Secondary | ICD-10-CM | POA: Diagnosis not present

## 2023-09-29 DIAGNOSIS — H35412 Lattice degeneration of retina, left eye: Secondary | ICD-10-CM

## 2023-09-29 DIAGNOSIS — E119 Type 2 diabetes mellitus without complications: Secondary | ICD-10-CM

## 2023-09-29 DIAGNOSIS — H33322 Round hole, left eye: Secondary | ICD-10-CM | POA: Diagnosis not present

## 2023-09-29 DIAGNOSIS — Z961 Presence of intraocular lens: Secondary | ICD-10-CM | POA: Diagnosis not present

## 2023-10-01 ENCOUNTER — Encounter (INDEPENDENT_AMBULATORY_CARE_PROVIDER_SITE_OTHER): Payer: Self-pay | Admitting: Ophthalmology

## 2023-10-12 DIAGNOSIS — I2584 Coronary atherosclerosis due to calcified coronary lesion: Secondary | ICD-10-CM | POA: Diagnosis not present

## 2023-10-12 DIAGNOSIS — E119 Type 2 diabetes mellitus without complications: Secondary | ICD-10-CM | POA: Diagnosis not present

## 2023-10-12 DIAGNOSIS — G4730 Sleep apnea, unspecified: Secondary | ICD-10-CM | POA: Diagnosis not present

## 2023-11-28 DIAGNOSIS — M545 Low back pain, unspecified: Secondary | ICD-10-CM | POA: Diagnosis not present

## 2023-11-28 DIAGNOSIS — I2584 Coronary atherosclerosis due to calcified coronary lesion: Secondary | ICD-10-CM | POA: Diagnosis not present

## 2023-11-28 DIAGNOSIS — H35412 Lattice degeneration of retina, left eye: Secondary | ICD-10-CM | POA: Diagnosis not present

## 2023-11-28 DIAGNOSIS — E039 Hypothyroidism, unspecified: Secondary | ICD-10-CM | POA: Diagnosis not present

## 2023-11-28 DIAGNOSIS — E785 Hyperlipidemia, unspecified: Secondary | ICD-10-CM | POA: Diagnosis not present

## 2023-11-28 DIAGNOSIS — Z853 Personal history of malignant neoplasm of breast: Secondary | ICD-10-CM | POA: Diagnosis not present

## 2023-11-28 DIAGNOSIS — E119 Type 2 diabetes mellitus without complications: Secondary | ICD-10-CM | POA: Diagnosis not present

## 2023-12-21 DIAGNOSIS — M79662 Pain in left lower leg: Secondary | ICD-10-CM | POA: Diagnosis not present

## 2023-12-21 DIAGNOSIS — M5442 Lumbago with sciatica, left side: Secondary | ICD-10-CM | POA: Diagnosis not present

## 2023-12-21 DIAGNOSIS — M545 Low back pain, unspecified: Secondary | ICD-10-CM | POA: Diagnosis not present

## 2023-12-28 DIAGNOSIS — M5442 Lumbago with sciatica, left side: Secondary | ICD-10-CM | POA: Diagnosis not present

## 2024-01-02 DIAGNOSIS — M545 Low back pain, unspecified: Secondary | ICD-10-CM | POA: Diagnosis not present

## 2024-01-02 DIAGNOSIS — M5442 Lumbago with sciatica, left side: Secondary | ICD-10-CM | POA: Diagnosis not present

## 2024-01-04 DIAGNOSIS — M5442 Lumbago with sciatica, left side: Secondary | ICD-10-CM | POA: Diagnosis not present

## 2024-01-04 DIAGNOSIS — M545 Low back pain, unspecified: Secondary | ICD-10-CM | POA: Diagnosis not present

## 2024-01-09 DIAGNOSIS — M25552 Pain in left hip: Secondary | ICD-10-CM | POA: Diagnosis not present

## 2024-01-09 DIAGNOSIS — M7632 Iliotibial band syndrome, left leg: Secondary | ICD-10-CM | POA: Diagnosis not present

## 2024-01-09 DIAGNOSIS — M5442 Lumbago with sciatica, left side: Secondary | ICD-10-CM | POA: Diagnosis not present

## 2024-01-16 DIAGNOSIS — M545 Low back pain, unspecified: Secondary | ICD-10-CM | POA: Diagnosis not present

## 2024-01-24 DIAGNOSIS — H35412 Lattice degeneration of retina, left eye: Secondary | ICD-10-CM | POA: Diagnosis not present

## 2024-01-24 DIAGNOSIS — Z961 Presence of intraocular lens: Secondary | ICD-10-CM | POA: Diagnosis not present

## 2024-01-24 DIAGNOSIS — H5211 Myopia, right eye: Secondary | ICD-10-CM | POA: Diagnosis not present

## 2024-01-24 DIAGNOSIS — E119 Type 2 diabetes mellitus without complications: Secondary | ICD-10-CM | POA: Diagnosis not present

## 2024-02-05 DIAGNOSIS — D1801 Hemangioma of skin and subcutaneous tissue: Secondary | ICD-10-CM | POA: Diagnosis not present

## 2024-02-05 DIAGNOSIS — Z85828 Personal history of other malignant neoplasm of skin: Secondary | ICD-10-CM | POA: Diagnosis not present

## 2024-02-05 DIAGNOSIS — L821 Other seborrheic keratosis: Secondary | ICD-10-CM | POA: Diagnosis not present

## 2024-02-05 DIAGNOSIS — L814 Other melanin hyperpigmentation: Secondary | ICD-10-CM | POA: Diagnosis not present

## 2024-02-05 DIAGNOSIS — L918 Other hypertrophic disorders of the skin: Secondary | ICD-10-CM | POA: Diagnosis not present

## 2024-02-08 DIAGNOSIS — M5442 Lumbago with sciatica, left side: Secondary | ICD-10-CM | POA: Diagnosis not present

## 2024-03-04 ENCOUNTER — Other Ambulatory Visit: Payer: Self-pay | Admitting: Endocrinology

## 2024-03-04 DIAGNOSIS — Z0000 Encounter for general adult medical examination without abnormal findings: Secondary | ICD-10-CM

## 2024-03-14 DIAGNOSIS — I2584 Coronary atherosclerosis due to calcified coronary lesion: Secondary | ICD-10-CM | POA: Diagnosis not present

## 2024-03-14 DIAGNOSIS — G4730 Sleep apnea, unspecified: Secondary | ICD-10-CM | POA: Diagnosis not present

## 2024-03-14 DIAGNOSIS — E119 Type 2 diabetes mellitus without complications: Secondary | ICD-10-CM | POA: Diagnosis not present

## 2024-03-29 ENCOUNTER — Encounter (HOSPITAL_COMMUNITY): Payer: Self-pay

## 2024-04-05 ENCOUNTER — Ambulatory Visit
Admission: RE | Admit: 2024-04-05 | Discharge: 2024-04-05 | Disposition: A | Discharge status: Home / Self Care | Source: Ambulatory Visit | Attending: Endocrinology | Admitting: Endocrinology

## 2024-04-05 DIAGNOSIS — Z0000 Encounter for general adult medical examination without abnormal findings: Secondary | ICD-10-CM

## 2024-04-05 DIAGNOSIS — Z1231 Encounter for screening mammogram for malignant neoplasm of breast: Secondary | ICD-10-CM | POA: Diagnosis not present

## 2024-04-17 DIAGNOSIS — E119 Type 2 diabetes mellitus without complications: Secondary | ICD-10-CM | POA: Diagnosis not present

## 2024-04-17 DIAGNOSIS — E785 Hyperlipidemia, unspecified: Secondary | ICD-10-CM | POA: Diagnosis not present

## 2024-04-17 DIAGNOSIS — E039 Hypothyroidism, unspecified: Secondary | ICD-10-CM | POA: Diagnosis not present

## 2024-04-17 DIAGNOSIS — Z1212 Encounter for screening for malignant neoplasm of rectum: Secondary | ICD-10-CM | POA: Diagnosis not present

## 2024-04-17 DIAGNOSIS — M8580 Other specified disorders of bone density and structure, unspecified site: Secondary | ICD-10-CM | POA: Diagnosis not present

## 2024-04-22 DIAGNOSIS — Z1339 Encounter for screening examination for other mental health and behavioral disorders: Secondary | ICD-10-CM | POA: Diagnosis not present

## 2024-04-22 DIAGNOSIS — M8580 Other specified disorders of bone density and structure, unspecified site: Secondary | ICD-10-CM | POA: Diagnosis not present

## 2024-04-22 DIAGNOSIS — Z0000 Encounter for general adult medical examination without abnormal findings: Secondary | ICD-10-CM | POA: Diagnosis not present

## 2024-04-22 DIAGNOSIS — E785 Hyperlipidemia, unspecified: Secondary | ICD-10-CM | POA: Diagnosis not present

## 2024-04-22 DIAGNOSIS — H35412 Lattice degeneration of retina, left eye: Secondary | ICD-10-CM | POA: Diagnosis not present

## 2024-04-22 DIAGNOSIS — R82998 Other abnormal findings in urine: Secondary | ICD-10-CM | POA: Diagnosis not present

## 2024-04-22 DIAGNOSIS — Z1331 Encounter for screening for depression: Secondary | ICD-10-CM | POA: Diagnosis not present

## 2024-04-22 DIAGNOSIS — E039 Hypothyroidism, unspecified: Secondary | ICD-10-CM | POA: Diagnosis not present

## 2024-04-22 DIAGNOSIS — I2584 Coronary atherosclerosis due to calcified coronary lesion: Secondary | ICD-10-CM | POA: Diagnosis not present

## 2024-04-22 DIAGNOSIS — Z853 Personal history of malignant neoplasm of breast: Secondary | ICD-10-CM | POA: Diagnosis not present

## 2024-04-22 DIAGNOSIS — R32 Unspecified urinary incontinence: Secondary | ICD-10-CM | POA: Diagnosis not present

## 2024-04-22 DIAGNOSIS — E119 Type 2 diabetes mellitus without complications: Secondary | ICD-10-CM | POA: Diagnosis not present

## 2024-06-18 NOTE — Progress Notes (Signed)
 [facility] - Clinic Note  06/28/2024    CHIEF COMPLAINT Patient presents for Retina Follow Up  HISTORY OF PRESENT ILLNESS: Heather Patton is a 78 y.o. female who presents to the clinic today for:   HPI     Retina Follow Up   In left eye.  This started 9 months ago.  Duration of 9 months.  Since onset it is stable.  I, the attending physician,  performed the HPI with the patient and updated documentation appropriately.        Comments   9 month retina follow up ERM OS pt is reporting no vision changes noticed she denies any flashes or floaters       Last edited by Valdemar Rogue, MD on 07/02/2024 10:38 PM.     Patient states vision doing good OU.  Referring physician: Wanda Mae, MD [address]  [place] [address]  HISTORICAL INFORMATION:   Selected notes from the MEDICAL RECORD NUMBER Referred by Dr. Mae Ocular Hx- Retinal Hole OS    CURRENT MEDICATIONS: No current outpatient medications on file. (Ophthalmic Drugs)   No current facility-administered medications for this visit. (Ophthalmic Drugs)   Current Outpatient Medications (Other)  Medication Sig   aspirin EC 81 MG tablet Take 81 mg by mouth daily.   calcium citrate-vitamin D (CITRACAL+D) 315-200 MG-UNIT per tablet Take 1 tablet by mouth daily. Calcium citrate 630mg     Vitamin D  500iu    ezetimibe-simvastatin (VYTORIN) 10-80 MG per tablet Take 1 tablet by mouth at bedtime.     letrozole  (FEMARA ) 2.5 MG tablet Take 1 tablet (2.5 mg total) by mouth daily.   levothyroxine (SYNTHROID, LEVOTHROID) 150 MCG tablet Take 150 mcg by mouth daily.     No current facility-administered medications for this visit. (Other)   REVIEW OF SYSTEMS: ROS   Positive for: Musculoskeletal, Endocrine, Eyes Negative for: Constitutional, Gastrointestinal, Neurological, Skin, Genitourinary, HENT, Cardiovascular, Respiratory, Psychiatric, Allergic/Imm, Heme/Lymph Last edited by Resa Delon ORN, COT on 06/28/2024 12:52 PM.     ALLERGIES Allergies  Allergen Reactions   Latex Rash   PAST MEDICAL HISTORY Past Medical History:  Diagnosis Date   Breast cancer (HCC) 2013   Left   Degenerative joint disease    GERD (gastroesophageal reflux disease)    Hx of colonic polyps    Hyperlipidemia    Hypothyroidism    Osteopenia    Personal history of radiation therapy    April-May 2013    Radiation 02/28/12  6300 cGy   left breast   Sleep apnea    Past Surgical History:  Procedure Laterality Date   ABDOMINAL HYSTERECTOMY     BREAST BIOPSY  12/05/2011   left breast   BREAST LUMPECTOMY Left 2013   BUNIONECTOMY  1985   lt   CATARACT EXTRACTION     CERVICAL DISC SURGERY     COLONOSCOPY     TONSILLECTOMY     FAMILY HISTORY Family History  Problem Relation Age of Onset   Breast cancer Mother 35   Heart disease Father    Breast cancer Cousin    SOCIAL HISTORY Social History   Tobacco Use   Smoking status: Former    Current packs/day: 0.00    Types: Cigarettes    Quit date: 11/21/1978    Years since quitting: 45.6   Smokeless tobacco: Never  Vaping Use   Vaping status: Never Used  Substance Use Topics   Alcohol  use: Yes  Alcohol /week: 6.0 standard drinks of alcohol     Types: 6 Glasses of wine per week   Drug use: No       OPHTHALMIC EXAM:  Base Eye Exam     Visual Acuity (Snellen - Linear)       Right Left   Dist  20/25 20/20  OD N         Tonometry (Tonopen, 12:56 PM)       Right Left   Pressure 18 16         Pupils       Pupils Dark Light Shape React APD   Right PERRL 2 1 Round Brisk None   Left PERRL 2 1 Round Brisk None         Visual Fields       Left Right    Full Full         Extraocular Movement       Right Left    Full, Ortho Full, Ortho         Neuro/Psych     Oriented x3: Yes   Mood/Affect: Normal         Dilation     Both eyes: 2.5% Phenylephrine @ 12:56 PM           Slit Lamp and  Fundus Exam     External Exam       Right Left   External Normal Normal         Slit Lamp Exam       Right Left   Lids/Lashes Dermatochalasis - upper lid, Meibomian gland dysfunction Dermatochalasis - upper lid, Meibomian gland dysfunction   Conjunctiva/Sclera nasal pingueculum White and quiet   Cornea Well healed temporal cataract wound, fine endo pigment, trace PEE Mild Punctate epithelial erosions, mild tear film debris, Well healed temporal cataract wound, fine endo pigment   Anterior Chamber deep, clear, narrow temporal angle deep, clear, narrow temporal angle   Iris Round and dilated Round and dilated   Lens PC IOL in good position PC IOL in good position, 1+ Posterior capsular opacification superiorly   Anterior Vitreous Vitreous syneresis Vitreous syneresis, Posterior vitreous detachment         Fundus Exam       Right Left   Disc Pink and sharp, mild PPP Pink and sharp, mild temporal PPA/PPP   C/D Ratio 0.6 0.4   Macula Flat, Good foveal reflex, RPE mottling, No heme or edema Blunted foveal reflex, mild ERM with mild striae and retinal thickening superior macula, no heme   Vessels attenuated, mild tortuosity attenuated, mild tortuosity   Periphery Attached, No heme, no RT/RD, mild Reticular degeneration Pigmented lattice with atrophic hole at 1230 -- vitreous condensation overlying -- with good laser surrounding, mild Reticular degeneration           IMAGING AND PROCEDURES  Imaging and Procedures for 06/28/2024  OCT, Retina - OU - Both Eyes       Right Eye Quality was good. Central Foveal Thickness: 278. Progression has been stable. Findings include normal foveal contour, no IRF, no SRF.   Left Eye Quality was good. Central Foveal Thickness: 426. Progression has been stable. Findings include no IRF, no SRF, abnormal foveal contour, epiretinal membrane, macular pucker (ERM with pucker, focal retinal thickening superior fovea, no cystic changes).    Notes *Images captured and stored on drive  Diagnosis / Impression:  OD: NFP; no IRF/SRF  OS: ERM with pucker, focal retinal thickening superior fovea, no  cystic changes  Clinical management:  See below  Abbreviations: NFP - Normal foveal profile. CME - cystoid macular edema. PED - pigment epithelial detachment. IRF - intraretinal fluid. SRF - subretinal fluid. EZ - ellipsoid zone. ERM - epiretinal membrane. ORA - outer retinal atrophy. ORT - outer retinal tubulation. SRHM - subretinal hyper-reflective material. IRHM - intraretinal hyper-reflective material            ASSESSMENT/PLAN:    ICD-10-CM   1. Epiretinal membrane (ERM) of left eye  H35.372 OCT, Retina - OU - Both Eyes    2. Diabetes mellitus type 2 without retinopathy (HCC)  E11.9     3. Long-term (current) use of injectable non-insulin antidiabetic drugs  Z79.85     4. Lattice degeneration of left retina  H35.412     5. Retinal hole of left eye  H33.322     6. Pseudophakia of both eyes  Z96.1      1. Epiretinal membrane, left eye  - OCT shows: ERM with pucker, focal retinal thickening superior fovea, no cystic changes, mild interval improvement in foveal contour - BCVA 20/20 - asymptomatic, no metamorphopsia - no indication for surgery at this time - review of records dating back to Feb 2023 show stable vision and stable OCTs -- no progression of ERM - pt is cleared from a retina standpoint for release to Dr. McCuen and resumption of primary eye care  - pt can f/u here prn  2,3. Diabetes mellitus, type 2 without retinopathy - The incidence, risk factors for progression, natural history and treatment options for diabetic retinopathy  were discussed with patient.   - The need for close monitoring of blood glucose, blood pressure, and serum lipids, avoiding cigarette or any type of tobacco, and the need for long term follow up was also discussed with patient. - f/u in 1 year, sooner prn  4,5 . Lattice  degeneration w/ atrophic holes, OS - Pigmented lattice with atrophic hole at 1230, vitreous condensation overlying - s/p laser retinopexy OS (02.27.23) -- good laser surrounding - no new RT/RD or lattice - f/u here prn  6. Pseudophakia OU  - s/p CE/IOL OU by expert surgeon, Dr. Leslee  - IOLs in perfect position, doing well  - monitor  Ophthalmic Meds Ordered this visit:  No orders of the defined types were placed in this encounter.    Return if symptoms worsen or fail to improve.  There are no Patient Instructions on file for this visit.  This document serves as a record of services personally performed by Redell JUDITHANN Hans, MD, PhD. It was created on their behalf by Avelina Pereyra, COA an ophthalmic technician. The creation of this record is the provider's dictation and/or activities during the visit.   Electronically signed by: Avelina GORMAN Pereyra, COT  07/02/24  10:41 PM   This document serves as a record of services personally performed by Redell JUDITHANN Hans, MD, PhD. It was created on their behalf by Auston Muzzy, COMT. The creation of this record is the provider's dictation and/or activities during the visit.  Electronically signed by: Auston Muzzy, COMT 07/02/24 10:41 PM  Redell JUDITHANN Hans, M.D., Ph.D. Diseases & Surgery of the Retina and Vitreous [facility]  I have reviewed the above documentation for accuracy and completeness, and I agree with the above. Redell JUDITHANN Hans, M.D., Ph.D. 07/02/24 10:44 PM   Abbreviations: M myopia (nearsighted); A astigmatism; H hyperopia (farsighted); P presbyopia; Mrx spectacle prescription;  CTL  contact lenses; OD right eye; OS left eye; OU both eyes  XT exotropia; ET esotropia; PEK punctate epithelial keratitis; PEE punctate epithelial erosions; DES dry eye syndrome; MGD meibomian gland dysfunction; ATs artificial tears; PFAT's preservative free artificial tears; NSC nuclear sclerotic cataract; PSC posterior subcapsular  cataract; ERM epi-retinal membrane; PVD posterior vitreous detachment; RD retinal detachment; DM diabetes mellitus; DR diabetic retinopathy; NPDR non-proliferative diabetic retinopathy; PDR proliferative diabetic retinopathy; CSME clinically significant macular edema; DME diabetic macular edema; dbh dot blot hemorrhages; CWS cotton wool spot; POAG primary open angle glaucoma; C/D cup-to-disc ratio; HVF humphrey visual field; GVF goldmann visual field; OCT optical coherence tomography; IOP intraocular pressure; BRVO Branch retinal vein occlusion; CRVO central retinal vein occlusion; CRAO central retinal artery occlusion; BRAO branch retinal artery occlusion; RT retinal tear; SB scleral buckle; PPV pars plana vitrectomy; VH Vitreous hemorrhage; PRP panretinal laser photocoagulation; IVK intravitreal kenalog; VMT vitreomacular traction; MH Macular hole;  NVD neovascularization of the disc; NVE neovascularization elsewhere; AREDS age related eye disease study; ARMD age related macular degeneration; POAG primary open angle glaucoma; EBMD epithelial/anterior basement membrane dystrophy; ACIOL anterior chamber intraocular lens; IOL intraocular lens; PCIOL posterior chamber intraocular lens; Phaco/IOL phacoemulsification with intraocular lens placement; PRK photorefractive keratectomy; LASIK laser assisted in situ keratomileusis; HTN hypertension; DM diabetes mellitus; COPD chronic obstructive pulmonary disease

## 2024-06-28 ENCOUNTER — Encounter (INDEPENDENT_AMBULATORY_CARE_PROVIDER_SITE_OTHER): Payer: Self-pay | Admitting: Ophthalmology

## 2024-06-28 ENCOUNTER — Ambulatory Visit (INDEPENDENT_AMBULATORY_CARE_PROVIDER_SITE_OTHER): Payer: Medicare PPO | Admitting: Ophthalmology

## 2024-06-28 DIAGNOSIS — Z961 Presence of intraocular lens: Secondary | ICD-10-CM

## 2024-06-28 DIAGNOSIS — Z7985 Long-term (current) use of injectable non-insulin antidiabetic drugs: Secondary | ICD-10-CM

## 2024-06-28 DIAGNOSIS — E119 Type 2 diabetes mellitus without complications: Secondary | ICD-10-CM

## 2024-06-28 DIAGNOSIS — H35412 Lattice degeneration of retina, left eye: Secondary | ICD-10-CM

## 2024-06-28 DIAGNOSIS — H35372 Puckering of macula, left eye: Secondary | ICD-10-CM | POA: Diagnosis not present

## 2024-06-28 DIAGNOSIS — H33322 Round hole, left eye: Secondary | ICD-10-CM

## 2024-07-02 ENCOUNTER — Encounter (INDEPENDENT_AMBULATORY_CARE_PROVIDER_SITE_OTHER): Payer: Self-pay | Admitting: Ophthalmology

## 2024-07-23 DIAGNOSIS — M8589 Other specified disorders of bone density and structure, multiple sites: Secondary | ICD-10-CM | POA: Diagnosis not present

## 2024-07-30 DIAGNOSIS — E119 Type 2 diabetes mellitus without complications: Secondary | ICD-10-CM | POA: Diagnosis not present

## 2024-07-30 DIAGNOSIS — I2584 Coronary atherosclerosis due to calcified coronary lesion: Secondary | ICD-10-CM | POA: Diagnosis not present

## 2024-08-24 DIAGNOSIS — Z23 Encounter for immunization: Secondary | ICD-10-CM | POA: Diagnosis not present
